# Patient Record
Sex: Male | Born: 1935 | Race: White | Hispanic: No | Smoking: Current every day smoker
Health system: Southern US, Community
[De-identification: ages and names within clinical notes are randomized; demographics above are authoritative.]

## PROBLEM LIST (undated history)

## (undated) DIAGNOSIS — H409 Unspecified glaucoma: Secondary | ICD-10-CM

## (undated) DIAGNOSIS — D649 Anemia, unspecified: Secondary | ICD-10-CM

## (undated) DIAGNOSIS — F419 Anxiety disorder, unspecified: Secondary | ICD-10-CM

## (undated) DIAGNOSIS — K589 Irritable bowel syndrome without diarrhea: Secondary | ICD-10-CM

## (undated) DIAGNOSIS — G2581 Restless legs syndrome: Secondary | ICD-10-CM

## (undated) DIAGNOSIS — I10 Essential (primary) hypertension: Secondary | ICD-10-CM

## (undated) DIAGNOSIS — Z8619 Personal history of other infectious and parasitic diseases: Secondary | ICD-10-CM

## (undated) HISTORY — DX: Irritable bowel syndrome, unspecified: K58.9

## (undated) HISTORY — DX: Essential (primary) hypertension: I10

## (undated) HISTORY — DX: Unspecified glaucoma: H40.9

## (undated) HISTORY — PX: BASAL CELL CARCINOMA EXCISION: SHX1214

## (undated) HISTORY — PX: TONSILLECTOMY AND ADENOIDECTOMY: SHX28

## (undated) HISTORY — DX: Restless legs syndrome: G25.81

## (undated) HISTORY — DX: Anemia, unspecified: D64.9

## (undated) HISTORY — DX: Anxiety disorder, unspecified: F41.9

## (undated) HISTORY — DX: Personal history of other infectious and parasitic diseases: Z86.19

---

## 1958-02-04 HISTORY — PX: THYROIDECTOMY: SHX17

## 1998-02-04 DIAGNOSIS — F419 Anxiety disorder, unspecified: Secondary | ICD-10-CM | POA: Insufficient documentation

## 2004-02-05 DIAGNOSIS — M810 Age-related osteoporosis without current pathological fracture: Secondary | ICD-10-CM | POA: Insufficient documentation

## 2004-02-05 DIAGNOSIS — R972 Elevated prostate specific antigen [PSA]: Secondary | ICD-10-CM | POA: Insufficient documentation

## 2005-04-22 ENCOUNTER — Inpatient Hospital Stay: Payer: Self-pay | Admitting: General Surgery

## 2005-04-22 ENCOUNTER — Ambulatory Visit: Payer: Self-pay | Admitting: Family Medicine

## 2005-04-24 ENCOUNTER — Other Ambulatory Visit: Payer: Self-pay

## 2006-02-04 HISTORY — PX: APPENDECTOMY: SHX54

## 2008-02-05 HISTORY — PX: HIP FRACTURE SURGERY: SHX118

## 2008-05-16 ENCOUNTER — Ambulatory Visit: Payer: Self-pay | Admitting: Family Medicine

## 2008-05-24 ENCOUNTER — Emergency Department: Payer: Self-pay | Admitting: Emergency Medicine

## 2008-05-27 ENCOUNTER — Ambulatory Visit: Payer: Self-pay | Admitting: Family Medicine

## 2008-07-07 ENCOUNTER — Ambulatory Visit: Payer: Self-pay | Admitting: Family Medicine

## 2008-08-01 ENCOUNTER — Ambulatory Visit: Payer: Self-pay | Admitting: Family Medicine

## 2008-11-09 DIAGNOSIS — G4700 Insomnia, unspecified: Secondary | ICD-10-CM | POA: Insufficient documentation

## 2009-01-07 ENCOUNTER — Inpatient Hospital Stay: Payer: Self-pay | Admitting: Specialist

## 2009-01-15 ENCOUNTER — Other Ambulatory Visit: Payer: Self-pay | Admitting: Family Medicine

## 2009-03-06 ENCOUNTER — Ambulatory Visit: Payer: Self-pay | Admitting: Family Medicine

## 2009-03-23 ENCOUNTER — Ambulatory Visit: Payer: Self-pay | Admitting: Family Medicine

## 2009-05-20 DIAGNOSIS — I279 Pulmonary heart disease, unspecified: Secondary | ICD-10-CM | POA: Insufficient documentation

## 2010-11-07 IMAGING — CR DG FEMUR 2V*R*
1 series · 4 of 4 positions shown · non-contrast
Comparison: none

REASON FOR EXAM: post op
COMMENTS:   Bedside (portable):Y

[Series 1: view not recorded · 0.17mm/px · 4 of 4 slices shown]
[im 1/4]
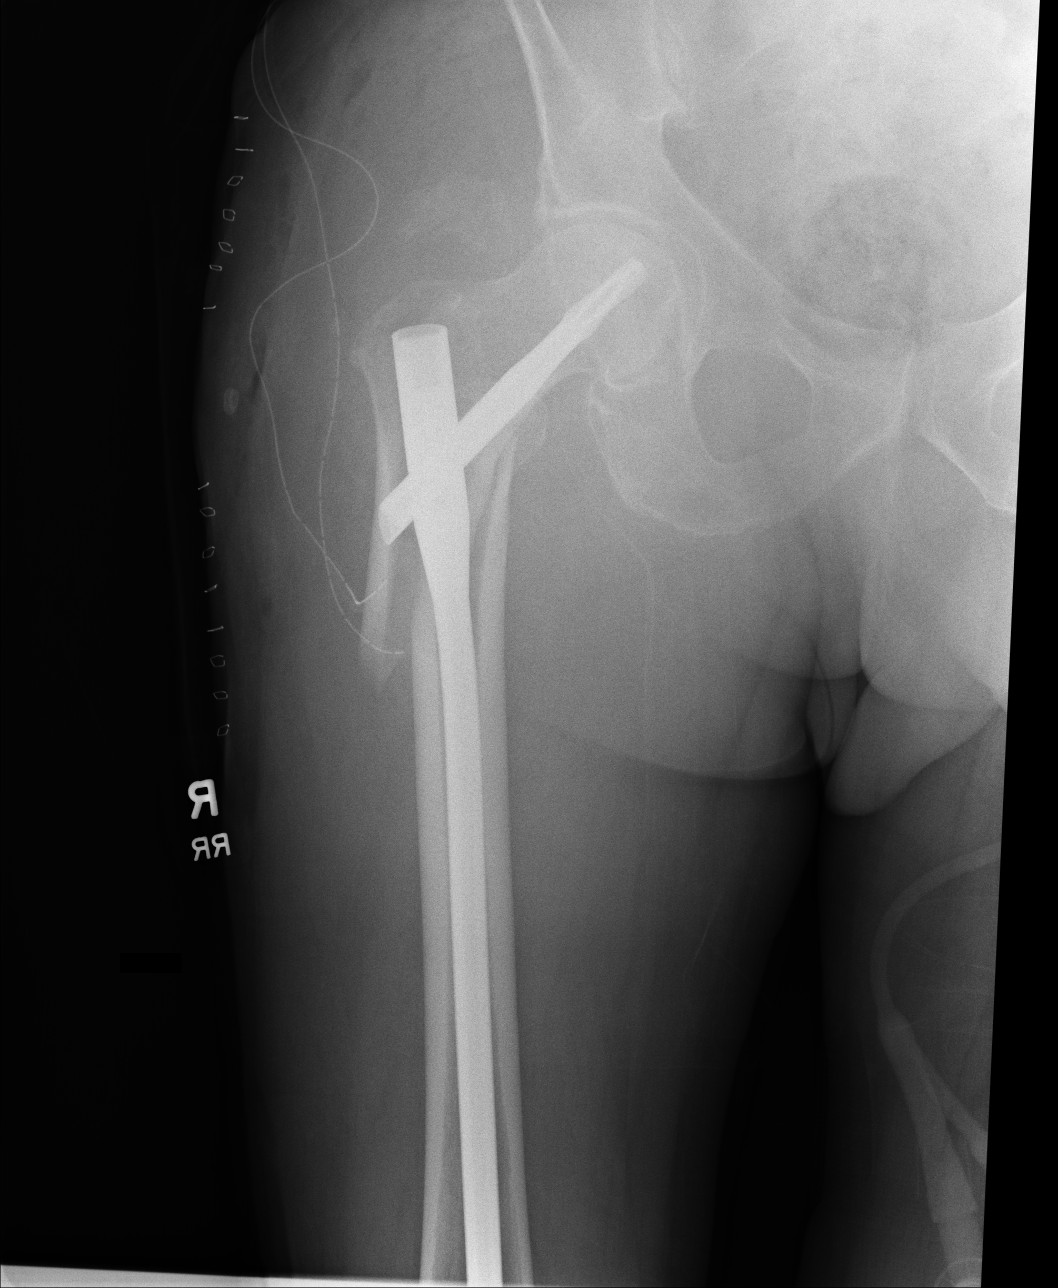
[im 2/4]
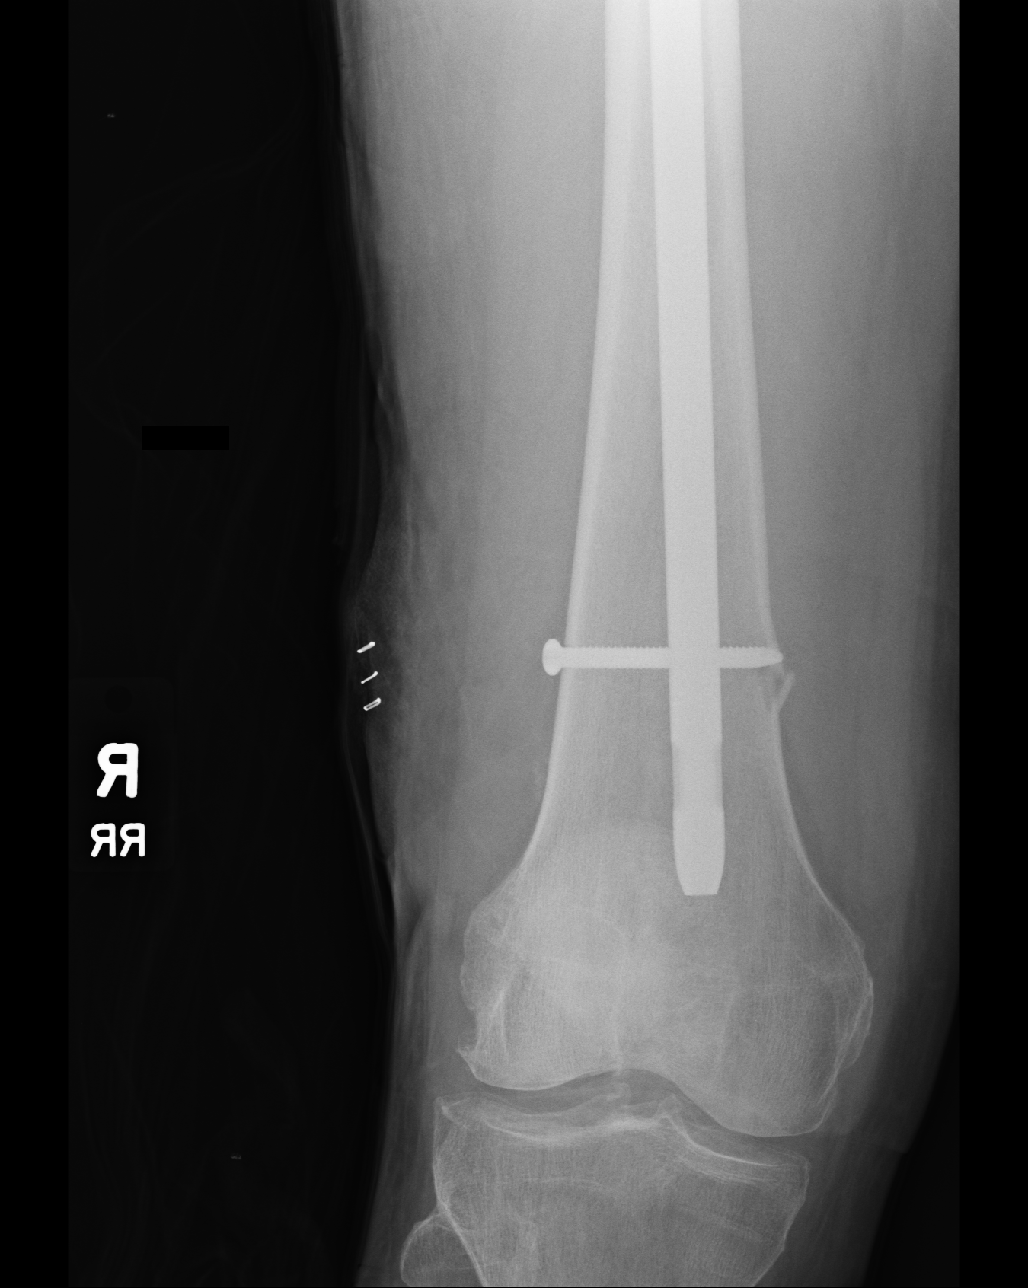
[im 3/4]
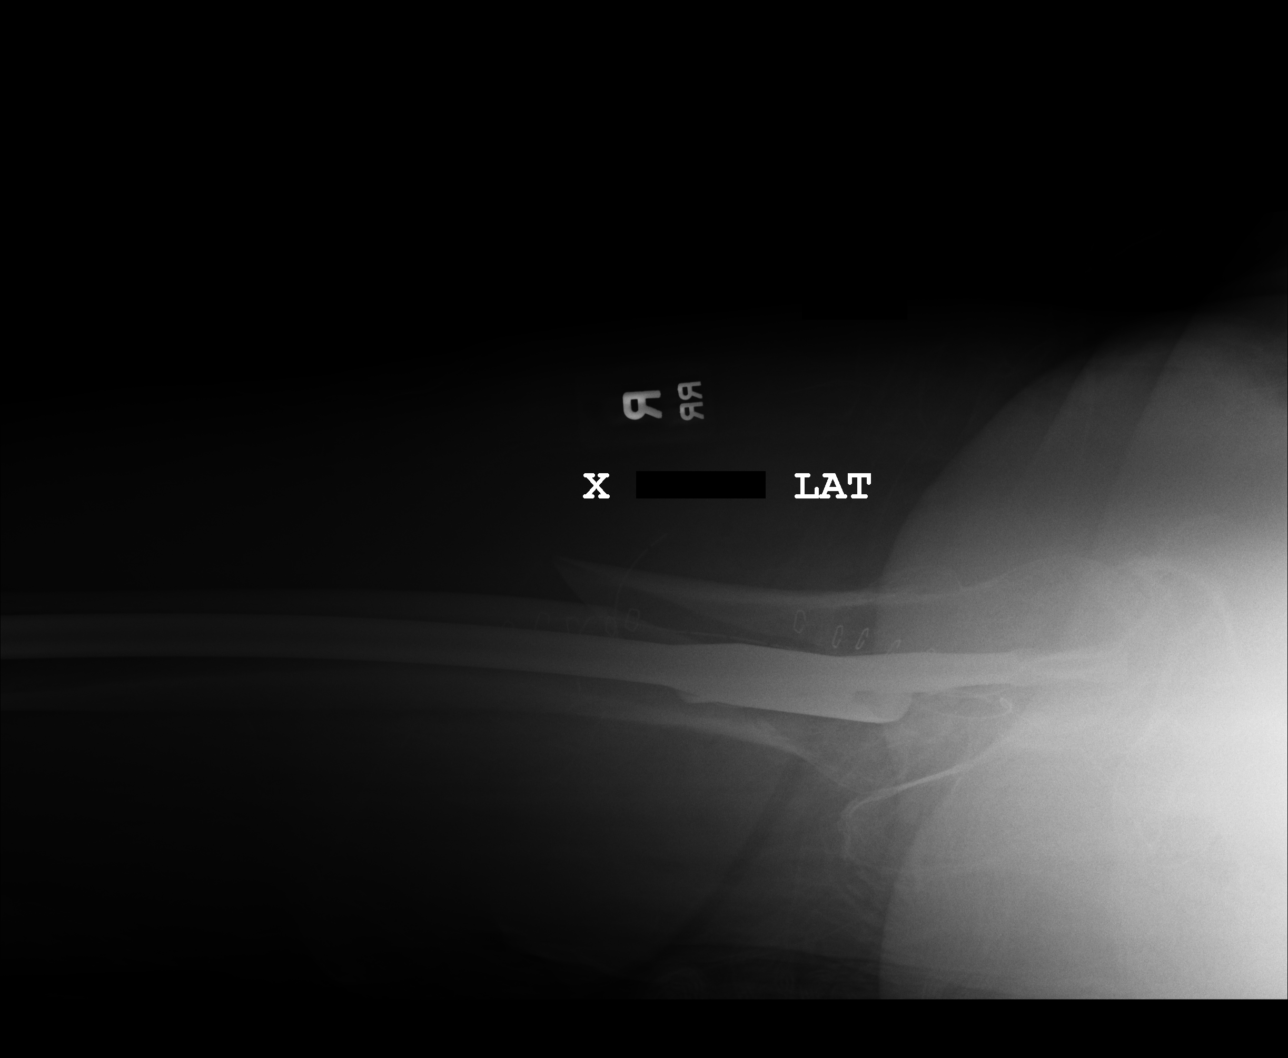
[im 4/4]
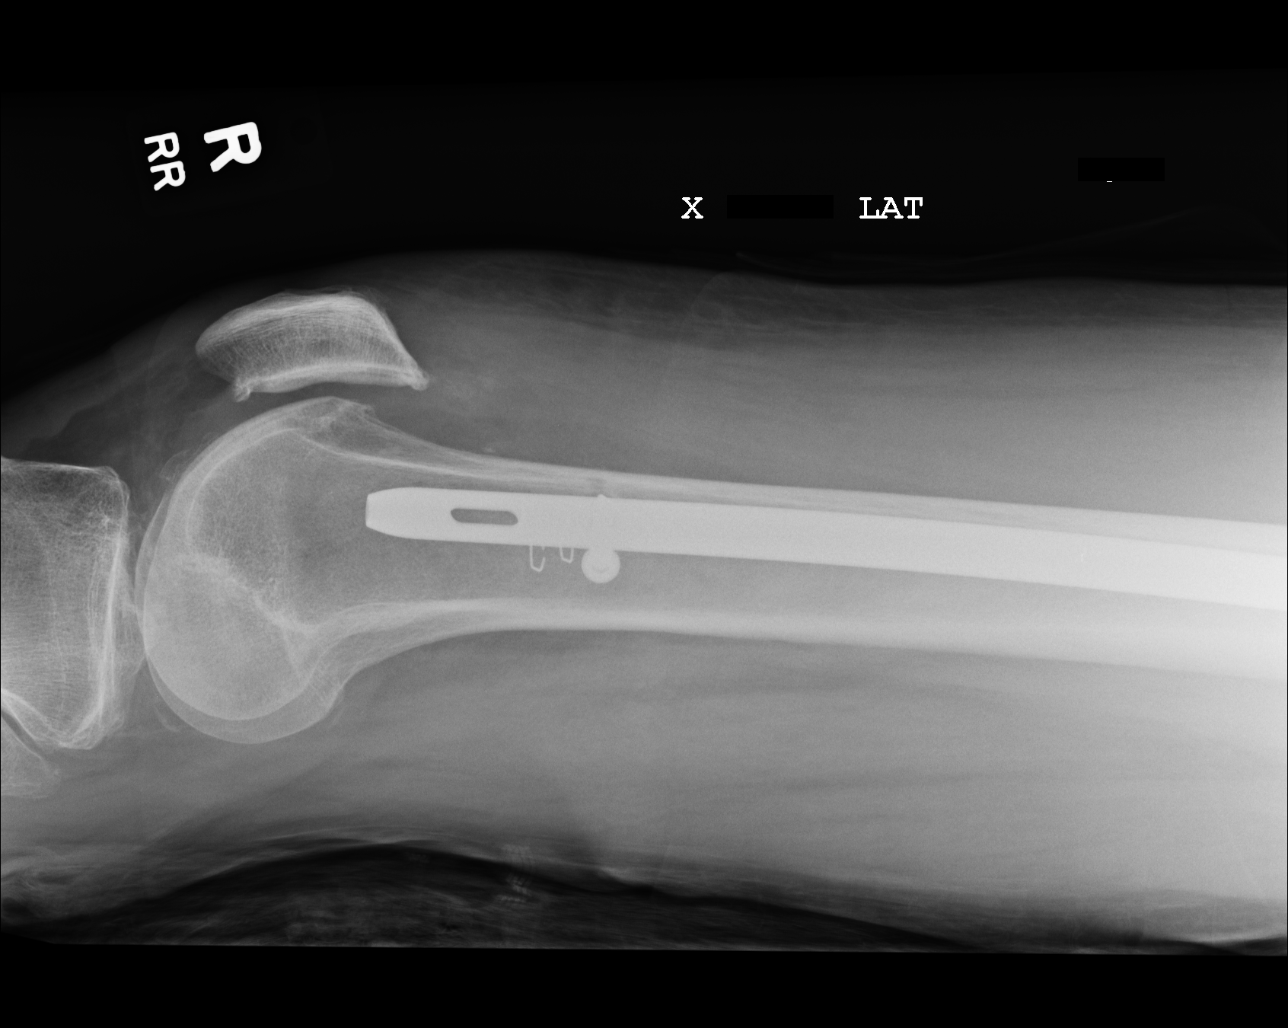

[4 of 4 positions shown; findings below may reference images not displayed]

PROCEDURE:     DXR - DXR FEMUR RIGHT  - January 08, 2009  [DATE]

RESULT:     The patient has undergone ORIF for an intertrochanteric/
subtrochanteric fracture. The orthopedic telescoping screw and
intramedullary rod appear to be in good radiographic position. There is
remains angulation at the fracture site. There is disruption of the medial
cortex at the tip of the cortical screw in the distal femur.
IMPRESSION: The patient has undergone ORIF for a
intertrochanteric-subtrochanteric fracture of the right hip. Further
interpretation is deferred to Dr. Klippel Metzker.

## 2010-11-08 IMAGING — CR DG FEMUR 2V*R*
1 series · 3 of 3 positions shown · non-contrast
Comparison: none

REASON FOR EXAM: PT heard pop while performing exercises
COMMENTS:   Bedside (portable):Y

[Series 1: view not recorded · 0.17mm/px · 3 of 3 slices shown]
[im 1/3]
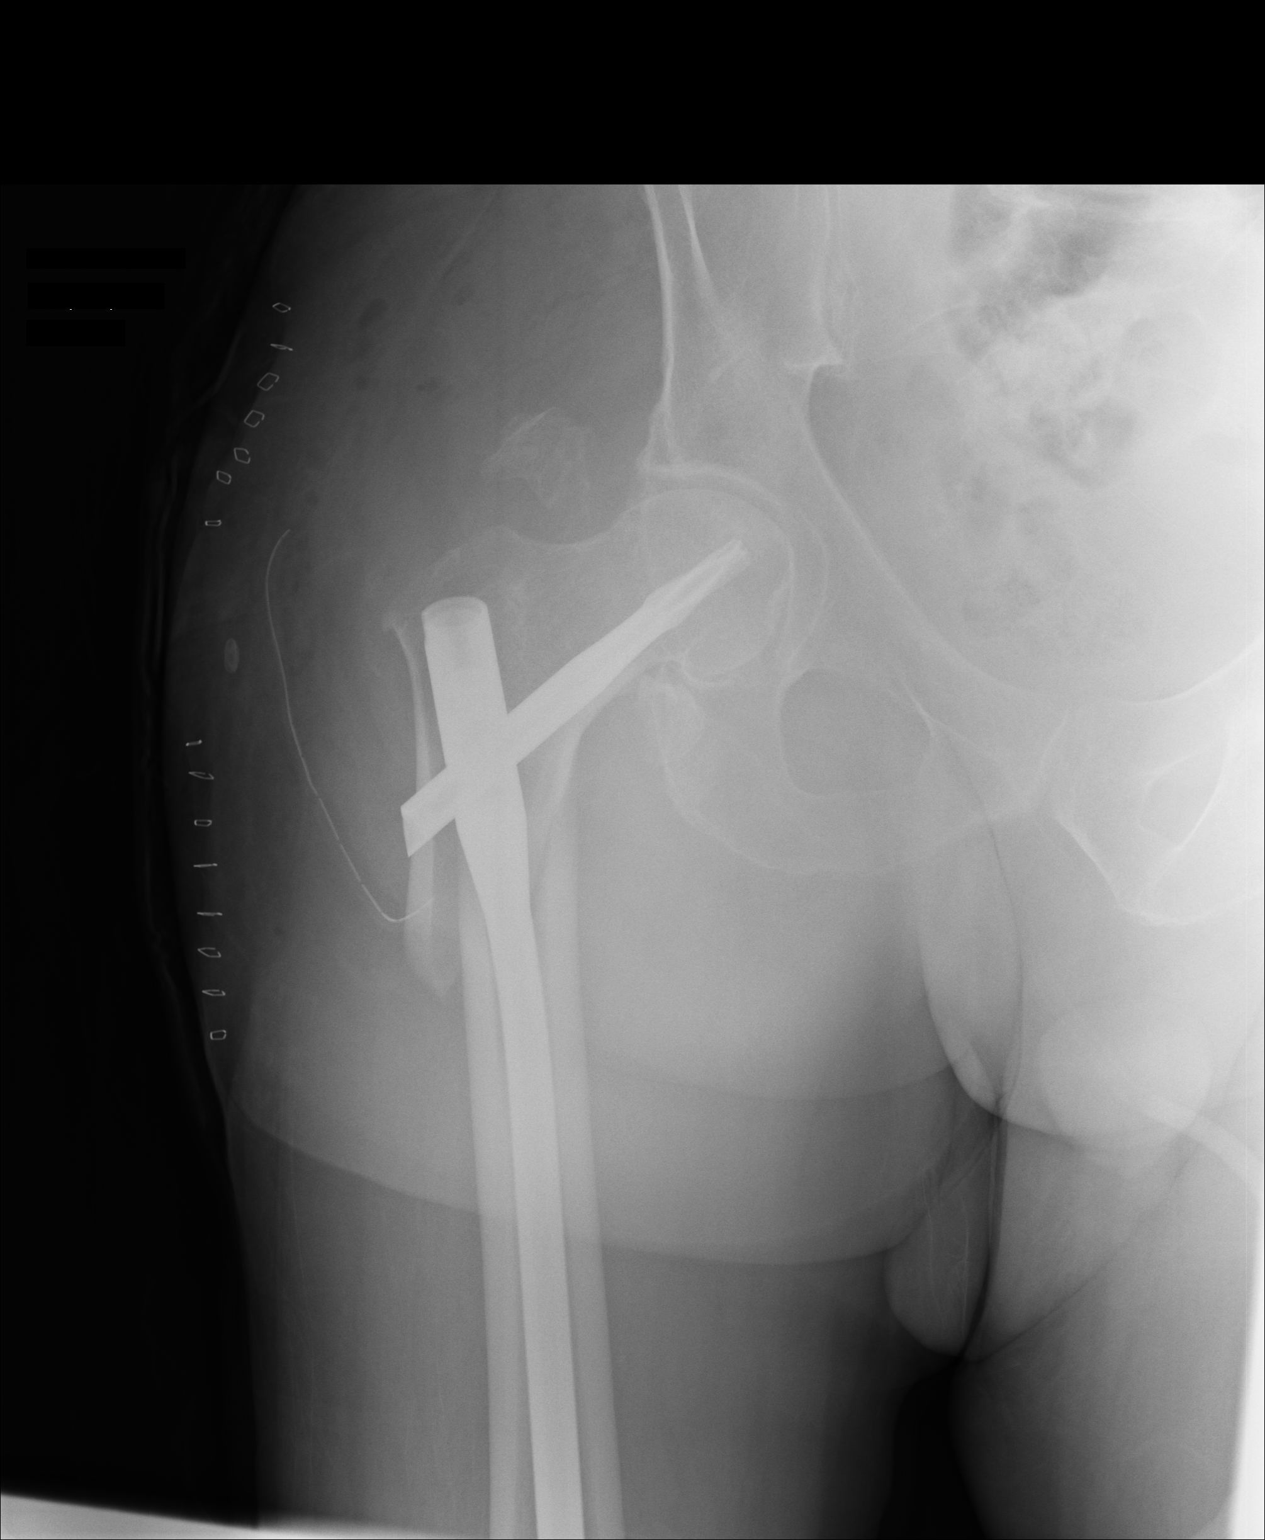
[im 2/3]
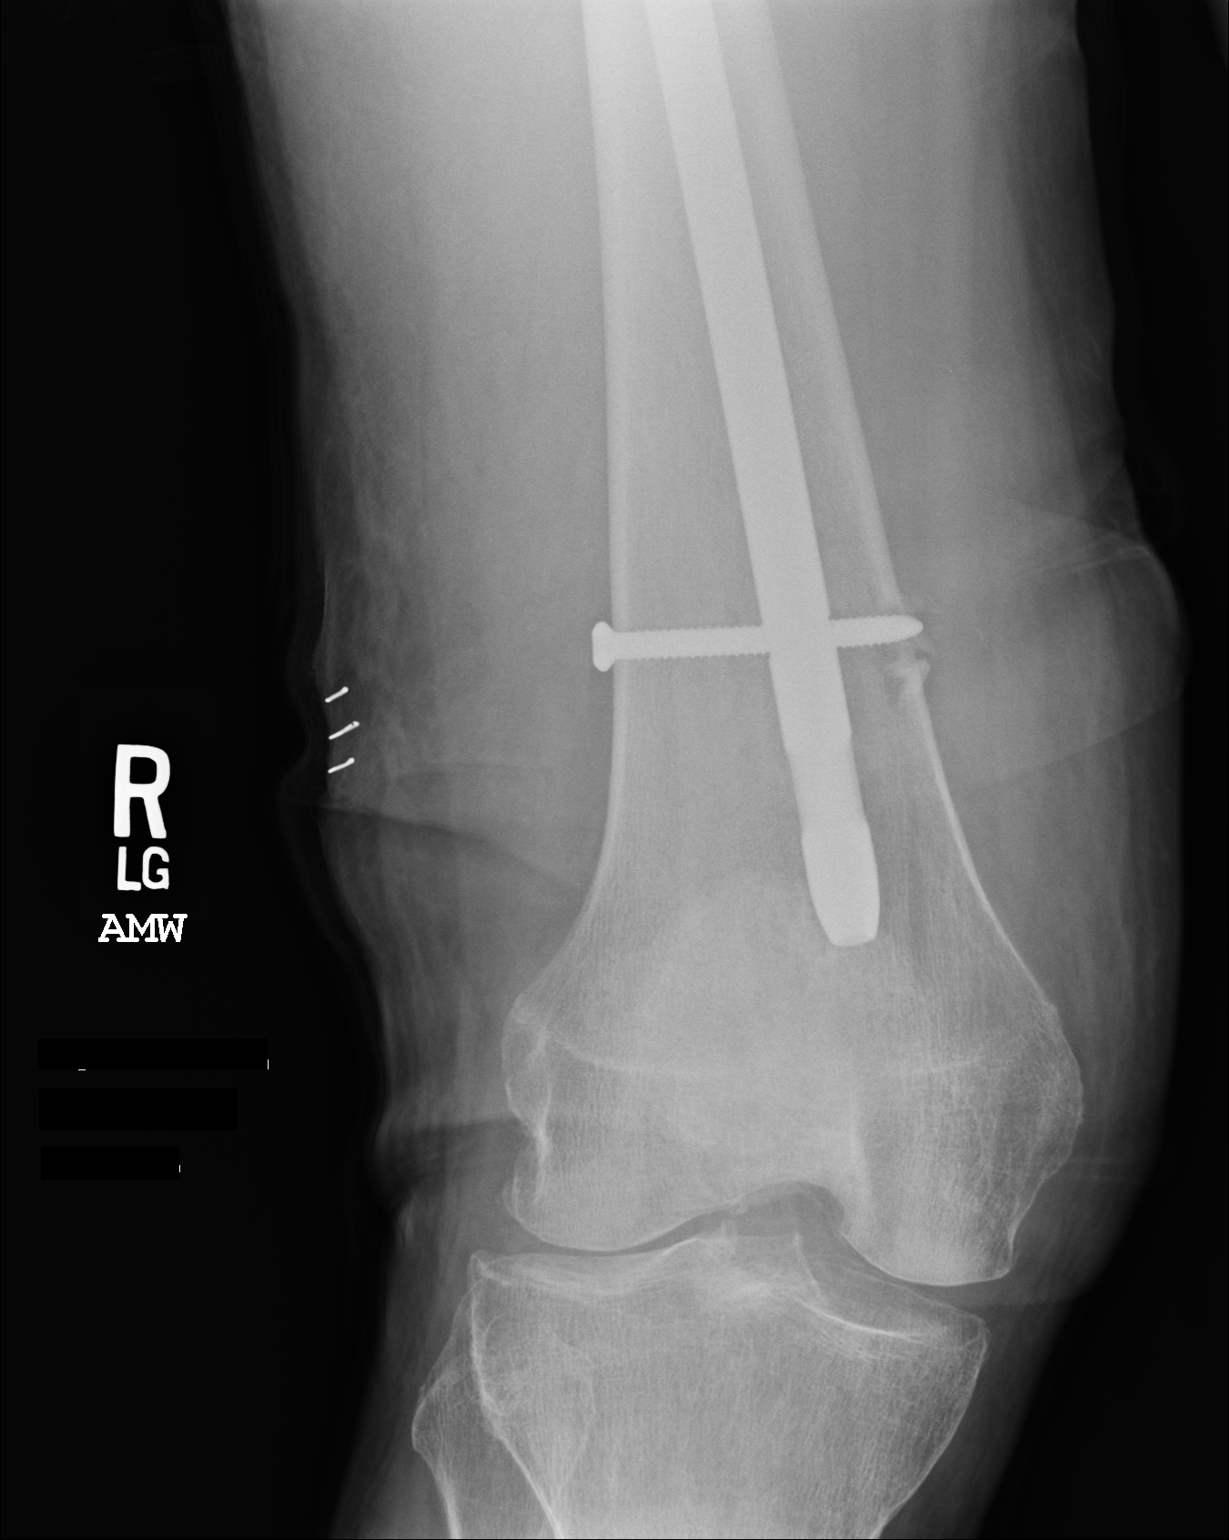
[im 3/3]
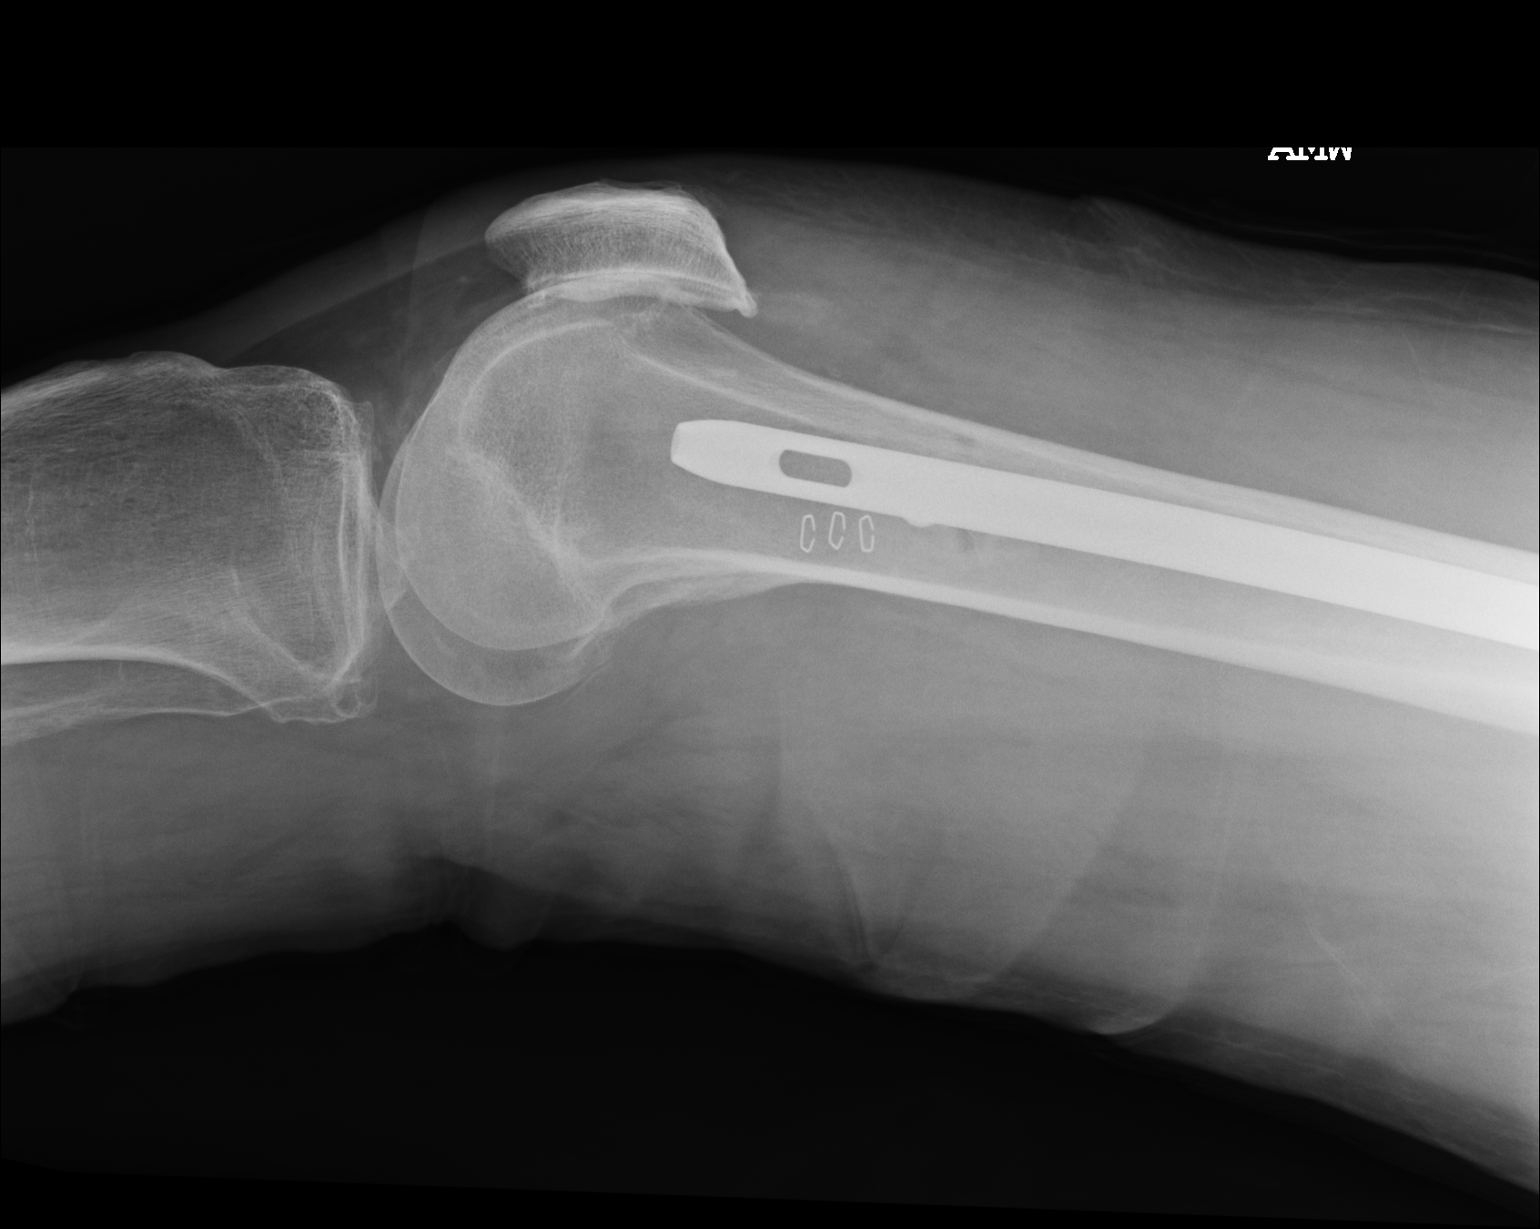

[3 of 3 positions shown; findings below may reference images not displayed]

PROCEDURE:     DXR - DXR FEMUR RIGHT  - January 09, 2009  [DATE]

RESULT:     Comparison is made to a prior study dated 01/08/2009.

The patient is status post intramedullary rod and sliding lag screw fixation
of the proximal hip fracture. Lateral angulation is appreciated at the
fracture site. The hardware appears intact without loosening or failure.
Cannulated screw fixation is appreciated within the distal femoral rod. The
medial portion of the screw projects in a region of cortical disruption of
the distal medial femur. When compared to the previous study there has been
no significant change. Skin staples and surgical drains are appreciated
along the hip region.
IMPRESSION: No significant change in the open reduction internal
fixation of the right femur.

## 2011-04-04 ENCOUNTER — Ambulatory Visit: Payer: Self-pay | Admitting: Family Medicine

## 2011-04-05 ENCOUNTER — Ambulatory Visit: Payer: Self-pay | Admitting: Internal Medicine

## 2011-04-06 ENCOUNTER — Inpatient Hospital Stay: Payer: Self-pay | Admitting: Internal Medicine

## 2011-04-06 LAB — URINALYSIS, COMPLETE
Bacteria: NONE SEEN
Bilirubin,UR: NEGATIVE
Glucose,UR: NEGATIVE mg/dL (ref 0–75)
Hyaline Cast: 9
Ph: 7 (ref 4.5–8.0)
Protein: NEGATIVE
Specific Gravity: 1.025 (ref 1.003–1.030)
Squamous Epithelial: NONE SEEN
WBC UR: <1 /HPF (ref 0–5)

## 2011-04-06 LAB — CBC
HCT: 22.7 % — ABNORMAL LOW (ref 40.0–52.0)
HGB: 7.7 g/dL — ABNORMAL LOW (ref 13.0–18.0)
MCHC: 33.8 g/dL (ref 32.0–36.0)
MCV: 105 fL — ABNORMAL HIGH (ref 80–100)
Platelet: 291 10*3/uL (ref 150–440)
RBC: 2.17 10*6/uL — ABNORMAL LOW (ref 4.40–5.90)
RDW: 13.5 % (ref 11.5–14.5)

## 2011-04-06 LAB — FERRITIN: Ferritin (ARMC): 74 ng/mL (ref 8–388)

## 2011-04-06 LAB — IRON AND TIBC
Iron Saturation: 22 %
Iron: 36 ug/dL — ABNORMAL LOW (ref 65–175)
Unbound Iron-Bind.Cap.: 128 ug/dL

## 2011-04-06 LAB — COMPREHENSIVE METABOLIC PANEL
Alkaline Phosphatase: 82 U/L (ref 50–136)
Anion Gap: 10 (ref 7–16)
BUN: 58 mg/dL — ABNORMAL HIGH (ref 7–18)
Calcium, Total: 8.2 mg/dL — ABNORMAL LOW (ref 8.5–10.1)
Co2: 33 mmol/L — ABNORMAL HIGH (ref 21–32)
EGFR (Non-African Amer.): 45 — ABNORMAL LOW
Osmolality: 281 (ref 275–301)
SGOT(AST): 19 U/L (ref 15–37)
Sodium: 132 mmol/L — ABNORMAL LOW (ref 136–145)

## 2011-04-06 LAB — FOLATE: Folic Acid: 4 ng/mL (ref 3.1–100.0)

## 2011-04-06 LAB — LIPASE, BLOOD: Lipase: 60 U/L — ABNORMAL LOW (ref 73–393)

## 2011-04-06 LAB — TROPONIN I: Troponin-I: <0.02 ng/mL

## 2011-04-07 HISTORY — PX: UPPER GI ENDOSCOPY: SHX6162

## 2011-04-07 LAB — CBC WITH DIFFERENTIAL/PLATELET
Basophil #: 0 10*3/uL (ref 0.0–0.1)
Basophil %: 0 %
Eosinophil %: 0 %
HCT: 26.9 % — ABNORMAL LOW (ref 40.0–52.0)
HGB: 9.1 g/dL — ABNORMAL LOW (ref 13.0–18.0)
Lymphocyte #: 0.4 10*3/uL — ABNORMAL LOW (ref 1.0–3.6)
MCH: 32.6 pg (ref 26.0–34.0)
MCV: 96 fL (ref 80–100)
Monocyte #: 0.5 10*3/uL (ref 0.0–0.7)
Monocyte %: 2.5 %
Neutrophil #: 17.6 10*3/uL — ABNORMAL HIGH (ref 1.4–6.5)
Platelet: 237 10*3/uL (ref 150–440)
RBC: 2.79 10*6/uL — ABNORMAL LOW (ref 4.40–5.90)
WBC: 18.4 10*3/uL — ABNORMAL HIGH (ref 3.8–10.6)

## 2011-04-07 LAB — BASIC METABOLIC PANEL
Anion Gap: 16 (ref 7–16)
BUN: 53 mg/dL — ABNORMAL HIGH (ref 7–18)
Chloride: 92 mmol/L — ABNORMAL LOW (ref 98–107)
Co2: 30 mmol/L (ref 21–32)
Creatinine: 1.11 mg/dL (ref 0.60–1.30)
EGFR (African American): >60
EGFR (Non-African Amer.): >60
Glucose: 133 mg/dL — ABNORMAL HIGH (ref 65–99)
Osmolality: 292 (ref 275–301)
Potassium: 3.2 mmol/L — ABNORMAL LOW (ref 3.5–5.1)
Sodium: 138 mmol/L (ref 136–145)

## 2011-04-07 LAB — HEMOGLOBIN
HGB: 8.4 g/dL — ABNORMAL LOW (ref 13.0–18.0)
HGB: 7.3 g/dL — ABNORMAL LOW (ref 13.0–18.0)

## 2011-04-08 LAB — CBC WITH DIFFERENTIAL/PLATELET
Basophil #: 0 10*3/uL (ref 0.0–0.1)
Eosinophil #: 0 10*3/uL (ref 0.0–0.7)
Lymphocyte %: 1.9 %
MCH: 31.1 pg (ref 26.0–34.0)
MCHC: 32.7 g/dL (ref 32.0–36.0)
Monocyte #: 0.7 10*3/uL (ref 0.0–0.7)
Monocyte %: 5.3 %
Neutrophil #: 12.7 10*3/uL — ABNORMAL HIGH (ref 1.4–6.5)
Neutrophil %: 92.8 %
Platelet: 167 10*3/uL (ref 150–440)
RDW: 21.2 % — ABNORMAL HIGH (ref 11.5–14.5)
WBC: 13.7 10*3/uL — ABNORMAL HIGH (ref 3.8–10.6)

## 2011-04-08 LAB — BASIC METABOLIC PANEL
BUN: 39 mg/dL — ABNORMAL HIGH (ref 7–18)
Calcium, Total: 7.4 mg/dL — ABNORMAL LOW (ref 8.5–10.1)
Chloride: 100 mmol/L (ref 98–107)
EGFR (African American): >60
EGFR (Non-African Amer.): >60
Glucose: 177 mg/dL — ABNORMAL HIGH (ref 65–99)
Osmolality: 295 (ref 275–301)
Potassium: 3.3 mmol/L — ABNORMAL LOW (ref 3.5–5.1)
Sodium: 141 mmol/L (ref 136–145)

## 2011-04-08 LAB — MAGNESIUM: Magnesium: 2.3 mg/dL

## 2011-04-08 LAB — URINE CULTURE

## 2011-04-08 LAB — HEMOGLOBIN A1C: Hemoglobin A1C: 5.7 % (ref 4.2–6.3)

## 2011-04-10 LAB — CBC WITH DIFFERENTIAL/PLATELET
Basophil #: 0 10*3/uL (ref 0.0–0.1)
HGB: 9.2 g/dL — ABNORMAL LOW (ref 13.0–18.0)
Lymphocyte #: 0.2 10*3/uL — ABNORMAL LOW (ref 1.0–3.6)
Lymphocyte %: 1.6 %
Monocyte #: 1 10*3/uL — ABNORMAL HIGH (ref 0.0–0.7)
Monocyte %: 6.7 %
Neutrophil %: 91.7 %
Platelet: 184 10*3/uL (ref 150–440)
RBC: 2.87 10*6/uL — ABNORMAL LOW (ref 4.40–5.90)
RDW: 19.5 % — ABNORMAL HIGH (ref 11.5–14.5)

## 2011-04-11 LAB — COMPREHENSIVE METABOLIC PANEL
Albumin: 2.5 g/dL — ABNORMAL LOW (ref 3.4–5.0)
BUN: 43 mg/dL — ABNORMAL HIGH (ref 7–18)
Calcium, Total: 7.6 mg/dL — ABNORMAL LOW (ref 8.5–10.1)
Chloride: 99 mmol/L (ref 98–107)
Co2: 26 mmol/L (ref 21–32)
EGFR (African American): >60
Osmolality: 287 (ref 275–301)
SGOT(AST): 90 U/L — ABNORMAL HIGH (ref 15–37)
SGPT (ALT): 77 U/L
Sodium: 138 mmol/L (ref 136–145)
Total Protein: 5 g/dL — ABNORMAL LOW (ref 6.4–8.2)

## 2011-04-11 LAB — HEMOGLOBIN: HGB: 8.2 g/dL — ABNORMAL LOW (ref 13.0–18.0)

## 2011-04-11 LAB — CBC WITH DIFFERENTIAL/PLATELET
Comment - H1-Com2: NORMAL
HCT: 22.4 % — ABNORMAL LOW (ref 40.0–52.0)
HGB: 7.4 g/dL — ABNORMAL LOW (ref 13.0–18.0)
MCH: 31.7 pg (ref 26.0–34.0)
MCHC: 33 g/dL (ref 32.0–36.0)
MCV: 96 fL (ref 80–100)
RDW: 20.1 % — ABNORMAL HIGH (ref 11.5–14.5)
Segmented Neutrophils: 76 %

## 2011-04-12 LAB — HEMOGLOBIN: HGB: 8.3 g/dL — ABNORMAL LOW (ref 13.0–18.0)

## 2011-04-12 LAB — URINE CULTURE

## 2011-04-12 LAB — HEMATOCRIT: HCT: 25 % — ABNORMAL LOW (ref 40.0–52.0)

## 2011-04-12 LAB — WBC: WBC: 21.2 10*3/uL — ABNORMAL HIGH (ref 3.8–10.6)

## 2011-04-13 LAB — CBC WITH DIFFERENTIAL/PLATELET
Basophil #: 0 10*3/uL (ref 0.0–0.1)
Basophil %: 0 %
Eosinophil %: 0.9 %
HCT: 23.7 % — ABNORMAL LOW (ref 40.0–52.0)
Lymphocyte #: 1.2 10*3/uL (ref 1.0–3.6)
Lymphocyte %: 7.4 %
MCHC: 33.1 g/dL (ref 32.0–36.0)
Monocyte %: 8.6 %
Neutrophil %: 83.1 %
Platelet: 197 10*3/uL (ref 150–440)
RBC: 2.52 10*6/uL — ABNORMAL LOW (ref 4.40–5.90)
RDW: 20.6 % — ABNORMAL HIGH (ref 11.5–14.5)
WBC: 16.1 10*3/uL — ABNORMAL HIGH (ref 3.8–10.6)

## 2011-04-13 LAB — BASIC METABOLIC PANEL
BUN: 17 mg/dL (ref 7–18)
Calcium, Total: 7.2 mg/dL — ABNORMAL LOW (ref 8.5–10.1)
Chloride: 97 mmol/L — ABNORMAL LOW (ref 98–107)
Co2: 27 mmol/L (ref 21–32)
Creatinine: 0.73 mg/dL (ref 0.60–1.30)
EGFR (Non-African Amer.): >60
Glucose: 136 mg/dL — ABNORMAL HIGH (ref 65–99)
Potassium: 3.4 mmol/L — ABNORMAL LOW (ref 3.5–5.1)
Sodium: 136 mmol/L (ref 136–145)

## 2011-04-15 LAB — CBC WITH DIFFERENTIAL/PLATELET
Basophil #: 0 10*3/uL (ref 0.0–0.1)
Basophil %: 0.1 %
Eosinophil %: 0.9 %
HGB: 9.9 g/dL — ABNORMAL LOW (ref 13.0–18.0)
Lymphocyte #: 1 10*3/uL (ref 1.0–3.6)
Lymphocyte %: 6.6 %
MCH: 30.5 pg (ref 26.0–34.0)
MCV: 93 fL (ref 80–100)
Monocyte %: 11.2 %
Platelet: 263 10*3/uL (ref 150–440)
RBC: 3.23 10*6/uL — ABNORMAL LOW (ref 4.40–5.90)

## 2011-05-06 ENCOUNTER — Ambulatory Visit: Payer: Self-pay | Admitting: Internal Medicine

## 2013-01-31 IMAGING — CR DG ABDOMEN 1V
1 series · 2 of 2 positions shown · non-contrast
Comparison: none

REASON FOR EXAM: constipation
COMMENTS:

[Series 1: supine kub · 0.17mm/px · 2 of 2 slices shown]
[im 1/2]
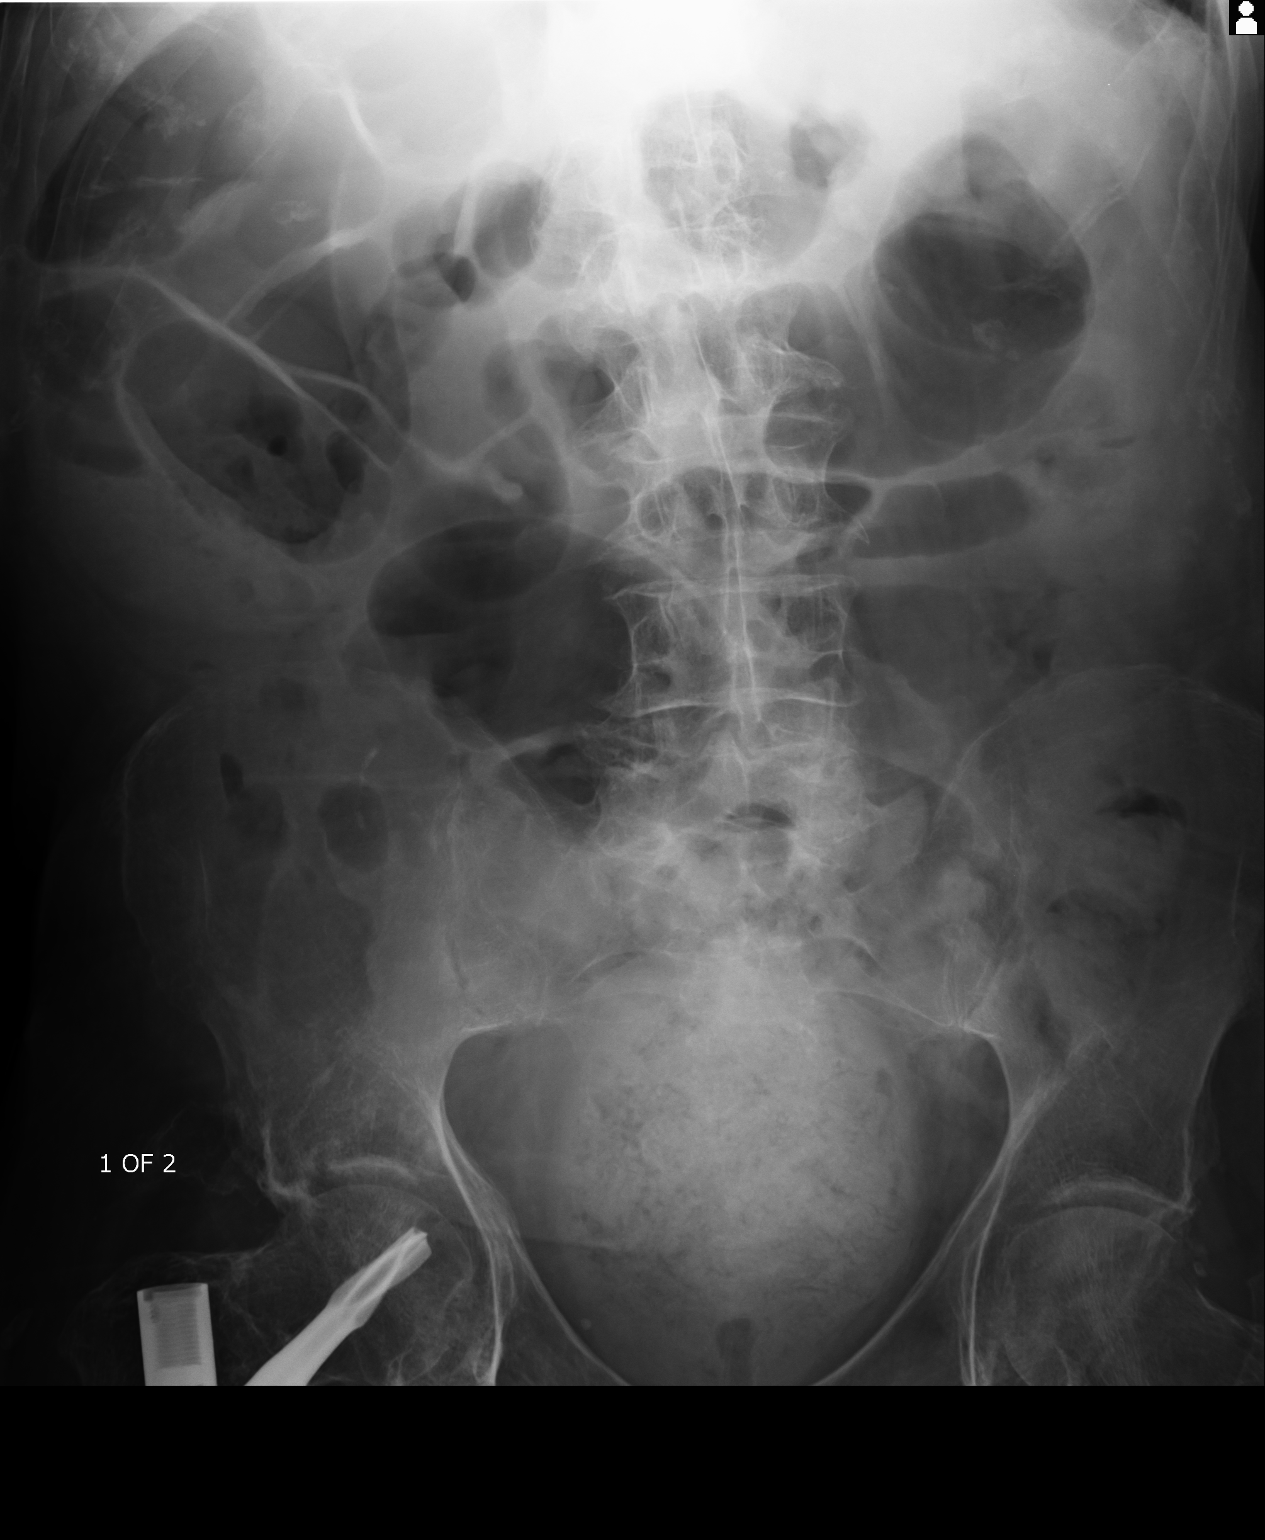
[im 2/2]
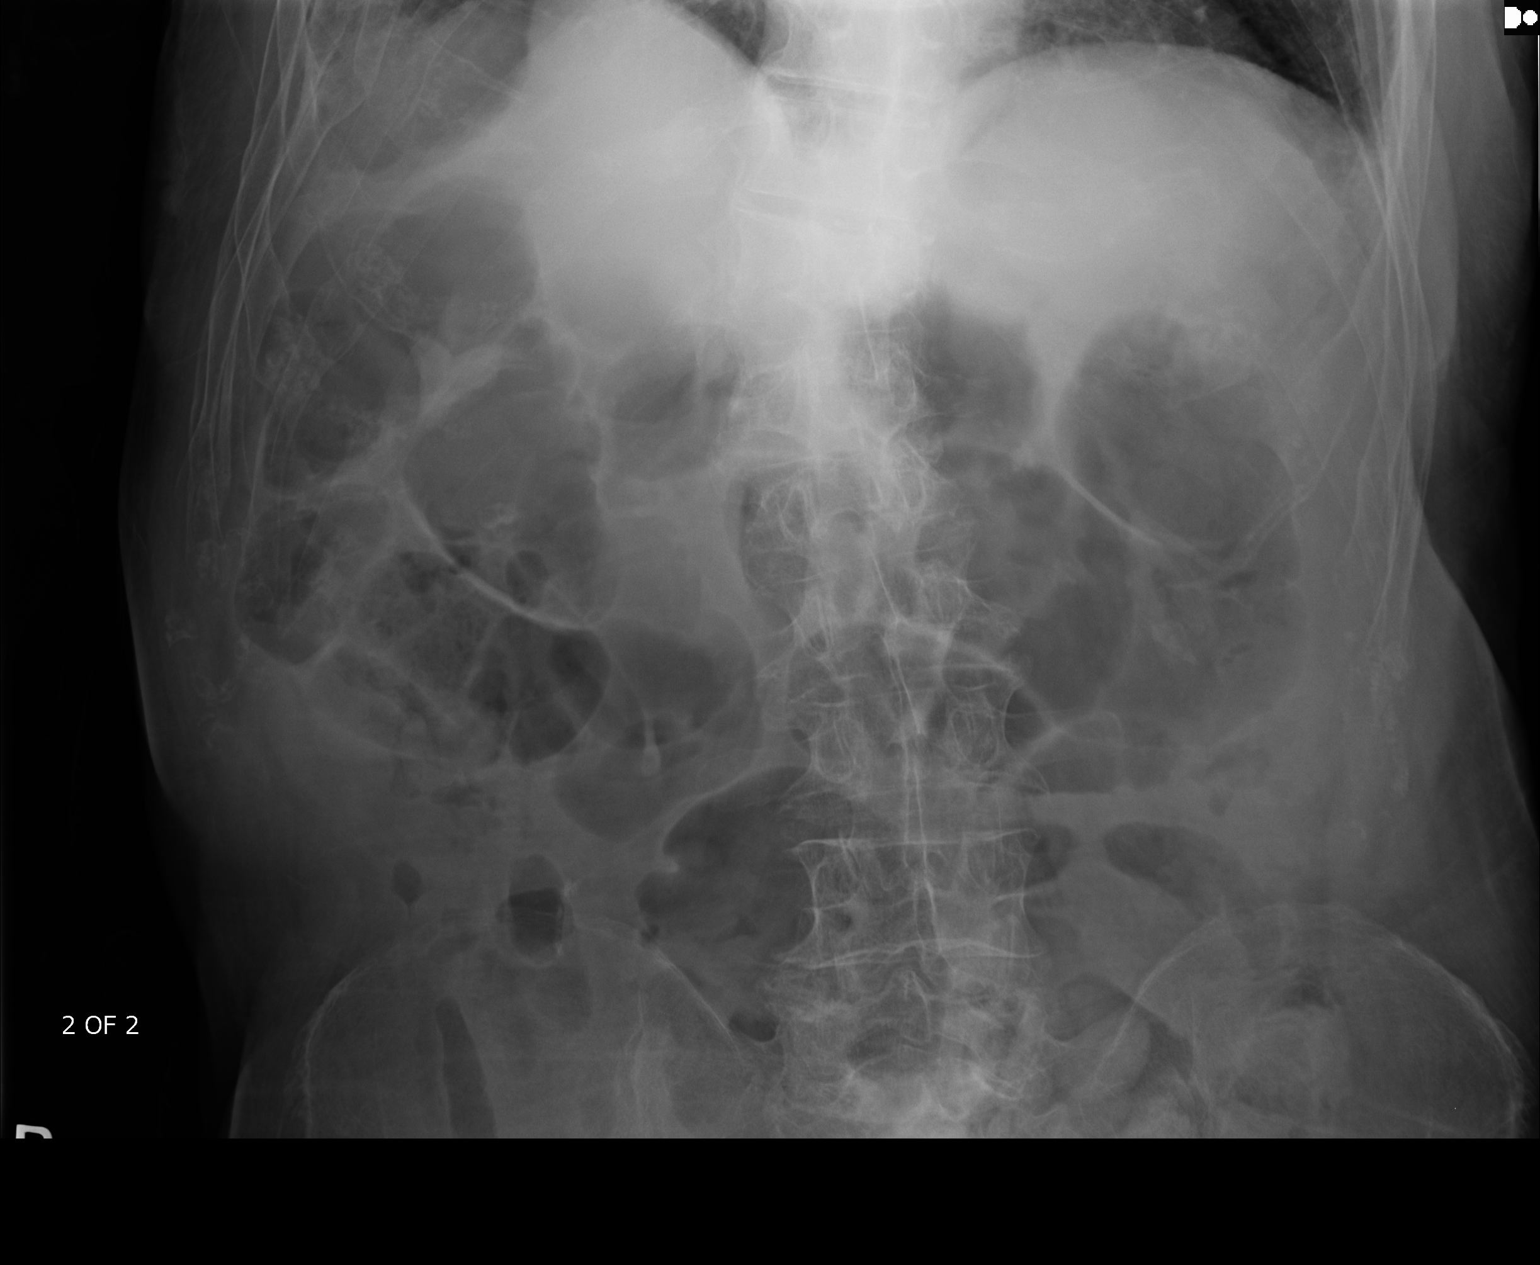

[2 of 2 positions shown; findings below may reference images not displayed]

PROCEDURE:     KDR - KDXR KIDNEY URETER BLADDER  - April 04, 2011  [DATE]

RESULT:     View of the abdomen shows a large amount of fecal material in
the area of the rectum and rectosigmoid region extending toward the region
of the anus consistent with a large fecal impaction. There is some air
within loops of slightly distended loops of small bowel in the upper
abdominal region. The visualized lung bases are clear. No pneumatosis or
perforation is evident. Previous ORIF of a right hip fracture is noted.
Degenerative changes are seen in the spine and left hip.
IMPRESSION: 1. Fecal impaction. Followup following disimpaction may be beneficial to
evaluate for bowel obstruction if the patient remains symptomatic.

## 2013-11-09 LAB — BASIC METABOLIC PANEL
BUN: 18 mg/dL (ref 4–21)
Creatinine: 0.9 mg/dL (ref 0.6–1.3)
Glucose: 88 mg/dL
Sodium: 138 mmol/L (ref 137–147)

## 2013-11-09 LAB — TSH: TSH: 3.33 u[IU]/mL (ref 0.41–5.90)

## 2014-05-29 NOTE — Consult Note (Signed)
Chief Complaint:   Subjective/Chief Complaint No bowel movements today. Repeat hemoglobin stable at 8.3 (was 8.2 last night) There are no signs of active bleeding and the drop in his H and H may have been due to some bleeding from the site of cautrization due to clot dislodgement. Will continue Octreotide and Protonix drip. Cancel EGD. Start clear liquids. CBC in am. Dr. Donnella Sham will follow him over the weekend.   Electronic Signatures: Jill Side (MD)  (Signed 08-Mar-13 11:47)  Authored: Chief Complaint   Last Updated: 08-Mar-13 11:47 by Jill Side (MD)

## 2014-05-29 NOTE — Consult Note (Signed)
Chief Complaint:   Subjective/Chief Complaint doing well denies n or vomiting, no bm since yesterday am.   VITAL SIGNS/ANCILLARY NOTES: **Vital Signs.:   09-Mar-13 15:15   Vital Signs Type 1 hr Post Blood   Temperature Temperature (F) 98.1   Celsius 36.7   Temperature Source oral   Pulse Pulse 90   Pulse source per Dinamap   Respirations Respirations 18   Systolic BP Systolic BP 505   Diastolic BP (mmHg) Diastolic BP (mmHg) 82   Mean BP 97   BP Source Dinamap   Pulse Ox % Pulse Ox % 98   Oxygen Delivery 3L   Brief Assessment:   Cardiac Regular    Respiratory clear BS    Gastrointestinal details normal Soft  Nontender  Nondistended  No masses palpable  Bowel sounds normal   Routine Chem:  07-Mar-13 13:23    BUN 43  Routine Hem:  09-Mar-13 04:20    WBC (CBC) 16.1   RBC (CBC) 2.52   Hemoglobin (CBC) 7.9   Hematocrit (CBC) 23.7   Platelet Count (CBC) 197   MCV 94   MCH 31.2   MCHC 33.1   RDW 20.6   Neutrophil % 83.1   Lymphocyte % 7.4   Monocyte % 8.6   Eosinophil % 0.9   Basophil % 0.0   Neutrophil # 13.4   Lymphocyte # 1.2   Monocyte # 1.4   Eosinophil # 0.1   Basophil # 0.0  Routine Chem:  09-Mar-13 15:13    Glucose, Serum 136   BUN 17   Creatinine (comp) 0.73   Sodium, Serum 136   Potassium, Serum 3.4   Chloride, Serum 97   CO2, Serum 27   Calcium (Total), Serum 7.2   Osmolality (calc) 276   eGFR (African American) >60   eGFR (Non-African American) >60   Anion Gap 12   Assessment/Plan:  Assessment/Plan:   Assessment 1) upper gi bleed due to duodenal ulcer.  some decrease of hgb this am, likely not indicative of recurrent bleeding as the bun has improved and there has been no bm since yesterday am.   Ulcer likely due to use of nsaid (meloxicam)  but will order h pylori serology.    Plan 1) as above, continue octreotide drip and clears liquids no red no carbonation. daily cbc. following until Dr Dionne Milo returns monday.   Electronic  Signatures: Loistine Simas (MD)  (Signed 09-Mar-13 17:32)  Authored: Chief Complaint, VITAL SIGNS/ANCILLARY NOTES, Brief Assessment, Lab Results, Assessment/Plan   Last Updated: 09-Mar-13 17:32 by Loistine Simas (MD)

## 2014-05-29 NOTE — Consult Note (Signed)
Impression: 81yo WM w/ h/o hypothyroidism admitted with duodenal ulcer with hemorrhage who has leukocytosis.  He has had repeated blood transfusion for persistent GI bleeding.  His endoscopy has shown several duodenal ulcers.  Currently his hgb is stable.  He denies any fever, chills or sweats.  There are no focal symptoms of an acute infectious process.  He has no infiltrate on CXR and cultures of the urine and blood have been negative. No signs of perforated ulcer. I suspect that his leukocytosis is due to the GI bleed.  The passage of blood in the GI tract can raise the WBC.  Would follow his CBC over time. No indication for antibiotics at this time.  Review of his WBC since 2010 have shown some elevations with some normal values.  Another possibility would be developing CML.  If he continues to show no signs of infection, but his WBC remains high, then this possibility should be explored.  There is no indication that this is AML and therefore there is no immediate need to start a work up for [facility].   Electronic Signatures: Laketha Leopard, Heinz Knuckles (MD) (Signed on 08-Mar-13 16:34)  Authored   Last Updated: 08-Mar-13 16:36 by Jibri Schriefer, Heinz Knuckles (MD)

## 2014-05-29 NOTE — Consult Note (Signed)
PATIENT NAME:  David Mcmillan, David Mcmillan MR#:  [id number] DATE OF BIRTH:  1932/11/05  DATE OF CONSULTATION:  04/12/2011  REFERRING PHYSICIAN:  Dr. Fritzi Mandes  CONSULTING PHYSICIAN:  Heinz Knuckles. Deigo Alonso, MD  REASON FOR CONSULTATION: Leukocytosis.   HISTORY OF PRESENT ILLNESS: Patient is a 81 year old white man with a past history significant for hypothyroidism who was admitted on 03/02 with anorexia and constipation. Patient is a very poor historian. When asked how he was doing he said he was fine. When I asked what brought him into the hospital he could not really say. When he was specifically questioned of whether he was having bleeding he did admit to having coughing up blood and to having blood from his rectum. When asked how long these symptoms have been going on he said about six months. Review of the history and physical did not reveal an exact timeframe of his symptoms. Patient was admitted to the hospital and found to be guaiac-positive. Subsequent work-up has demonstrated duodenal ulcers on EGD. At the time of his evaluation there was black gastric fluid present and a blood clot in the duodenal bulb overlying the ulcer. There is no active bleeding noted. He has received blood transfusions during his hospitalization but his hemoglobin has been stable over the last 24 hours or so. His white count on admission was 18.8 has gone down as low as 13.7 and then risen back up to 25.6 yesterday, today it was 21.2. He denies any fevers, chills or sweats. He has not had any headaches, sinus congestion, sore throat, swollen glands, neck stiffness, cough, shortness of breath or sputum production. He has had no abdominal pain. He has been having nausea and vomiting with coffee ground emesis as well as black stool. He denies any urinary complaints. Overall he is a fairly poor historian, however.   ALLERGIES: Percocet.   PAST MEDICAL HISTORY:  1. Hypothyroidism.  2. Hypertension.  3. 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  4. Melanoma.  5. Right hip fracture, status post ORIF.  6. Status post appendectomy.   SOCIAL HISTORY: Patient lives at home. He has a cat at home. He drinks heavily and smokes about half pack of cigarettes per day. No injecting drug use history.   FAMILY HISTORY: Positive for hypertension per the history and physical. The patient states that he has cancer in the family but can't be more specific.    REVIEW OF SYSTEMS: GENERAL: No fevers, chills, or sweats. Positive weakness that is improving. He is currently working with physical therapy. HEENT: No headaches. No sinus congestion. No sore throat. NECK: No stiffness. No swollen glands. RESPIRATORY: No cough. No shortness of breath. No sputum production. CARDIAC: No chest pains or palpitations. No peripheral edema. GASTROINTESTINAL: Positive nausea and vomiting with coffee ground emesis. No significant abdominal pain. Positive melena but no diarrhea. He has had constipation prior to his admission. GENITOURINARY: No complaints. MUSCULOSKELETAL: No complaints. SKIN: No rashes. NEUROLOGIC: No focal weakness. PSYCHIATRIC: No complaints. All other systems are negative.   PHYSICAL EXAMINATION:  VITAL SIGNS: T-max 99.8, T-current 98.1, pulse 85, blood pressure 145/89, 96% on 2 liters.   GENERAL: 81 year old white man mildly confused but in no acute distress.   HEENT: Normocephalic, atraumatic. Pupils are equal and reactive to light. Extraocular motion intact. Sclerae, conjunctivae, and lids are without evidence for emboli or petechiae. His conjunctivae were moderately pale. Oropharynx shows no erythema or exudate. Gums are in fair condition.   NECK: Supple. Full range of motion.  Midline trachea. No lymphadenopathy. No thyromegaly.   CHEST: Clear to auscultation bilaterally with good air movement. No focal consolidation.   CARDIAC: Regular rate and rhythm without murmur, rub, or gallop.   ABDOMEN: Soft, nontender, nondistended. No  hepatosplenomegaly. No hernias noted.   EXTREMITIES: No evidence for tenosynovitis.   SKIN: He is somewhat pale but no rashes were appreciated. No stigmata of endocarditis present, specifically no Janeway lesions nor Osler nodes.   NEUROLOGIC: The patient was awake and interactive. He was a moderately confused but able to answer questions to some extent. He was not able to provide a coherent history of what was going on at home prior to his admission.   PSYCHIATRIC: Mood and affect appeared pleasant.   LABORATORY, DIAGNOSTIC, AND RADIOLOGICAL DATA: BUN 43, creatinine 0.83 from yesterday. AST 90, ALT 77, alkaline phosphatase 76, total bilirubin 0.4. White count today 21.2, no differential was obtained. White count from yesterday was 25.6. Hemoglobin today 8.3. Blood cultures from yesterday show no growth. A urine culture shows no growth. There was no corresponding urinalysis. A urinalysis from 04/06/2011 had 1+ blood, negative nitrates, negative leukocyte esterase, 5 red cells and less than 1 white cell per high-power field. Urine culture grew mixed bacterial organisms. Helicobacter pylori IgG who was negative. A chest x-ray from admission shows a possible right hilar mass although the chest was rotated. A CT scan of the chest, abdomen, and pelvis without contrast showed mild basilar atelectasis, distention of the renal bulb and duodenal wall thickening, mild gastric distention, mild gallbladder distention. There was a large amount of stool noted throughout the colon particularly in the rectum. There was no note of any pulmonary mass. Bilateral lower extremity ultrasound shows no evidence for deep vein thrombosis.   IMPRESSION: 81 year old white man with a history of hypothyroidism admitted with duodenal ulcer with hemorrhage with leukocytosis.   RECOMMENDATIONS:  1. He has had repeat blood transfusions for persistent gastrointestinal bleeding. His endoscopy has shown several duodenal ulcers. Currently  his hemoglobin has been stable. He denies any fevers, chills or sweats. There are no focal symptoms of an acute infectious process. He has no infiltrate on chest x-ray and cultures of the urine and blood have been negative.  2. No signs of a perforated ulcer.  3. I suspect that his leukocytosis is due to the GI bleed. The passage of blood in the gastrointestinal tract can raise the white blood count. Would follow his CBC over time.  4. There is no indication for antibiotics at this time.  5. Review of his white count since 2010 have shown some elevations with some normal values. Another possibility for his leukocytosis would be developing CML. If he continues to show no signs of infection, but his white count remains high than this possibility should be explored. There is no indication at this time for acute myelogenous leukemia and therefore there is no immediate need to start  work-up for CML until we see what happens with his white count.   This is a moderately complex infectious disease case. Thank you very much for involving me in Mr. Brumett care.  ____________________________ Heinz Knuckles. Alexes Lamarque, MD meb:cms D: 04/12/2011 16:46:38 ET T: 04/12/2011 17:14:05 ET JOB#: [id number]  cc: Heinz Knuckles. Chiffon Kittleson, MD, <Dictator> Kourosh Jablonsky E Rayden Scheper MD ELECTRONICALLY SIGNED 04/16/2011 10:10

## 2014-05-29 NOTE — Consult Note (Signed)
Chief Complaint:   Subjective/Chief Complaint Case discussed wth Dr. Manuella Ghazi and his nurse. No vomiting or melena. Vitals stable. H and H improved.  Impression: Recent significant GI bleed from a duodenal ulcer s/p EGD with thermal therapy with good results. No signs of active bleeding.  Recommendations: Advance to full liquids. Decrease Sandostatin to 25 mics per hour. Change Protonix to IV BID due to problems with IV access. Repeat CBC in am.   VITAL SIGNS/ANCILLARY NOTES: **Vital Signs.:   05-Mar-13 04:42   Vital Signs Type Routine   Temperature Temperature (F) 97.9   Celsius 36.6   Temperature Source axillary   Pulse Pulse 83   Pulse source per Dinamap   Respirations Respirations 20   Systolic BP Systolic BP 867   Diastolic BP (mmHg) Diastolic BP (mmHg) 88   Mean BP 104   BP Source Dinamap   Pulse Ox % Pulse Ox % 92   Pulse Ox Activity Level  At rest   Oxygen Delivery 2L   Routine Hem:  05-Mar-13 05:13    Hemoglobin (CBC) 8.9   Electronic Signatures: Jill Side (MD)  (Signed 05-Mar-13 10:22)  Authored: Chief Complaint, VITAL SIGNS/ANCILLARY NOTES, Lab Results   Last Updated: 05-Mar-13 10:22 by Jill Side (MD)

## 2014-05-29 NOTE — Consult Note (Signed)
Chief Complaint:   Subjective/Chief Complaint Complaining of leg pain. Somewhat disoriented. No melena in last several hours. Hemoglobin at 8.2 after PRBC transfusion. HR fluctuates between 80 and 130 and may be secondary to pain and agitation.  Impression: Patient with deep duocenal ulcer with a visible vessel s/p cautrization 4 days ago. His hemoglobin dropped yesterday and he did have some dark stool. H and H improved. No further melena. BP well maintained although HR fluctuates from beeing normal to up to 130, most likely secondary to agitation rather than volume depletion.  Recommendations: Will repeat H and H now. Continue IV PPI and Octreotide. Will decide about repeat EGD based on repeat H and H and presence or abscence of any further melena. Repeat EGD with sedation is not without risk due to his overall condition and episodes of tachycardia. On the other hand if there are clear signs of active bleeding, repeat EGD may be helpfull. Will follow closely.   VITAL SIGNS/ANCILLARY NOTES: **Vital Signs.:   08-Mar-13 05:43   Vital Signs Type Routine   Temperature Temperature (F) 98.1   Celsius 36.7   Temperature Source oral   Pulse Pulse 86   Pulse source per Dinamap   Respirations Respirations 20   Systolic BP Systolic BP 102   Diastolic BP (mmHg) Diastolic BP (mmHg) 81   Mean BP 101   BP Source Dinamap   Pulse Ox % Pulse Ox % 96   Pulse Ox Activity Level  At rest   Oxygen Delivery 2L   Routine Hem:  08-Mar-13 05:26    WBC (CBC) 21.2   Electronic Signatures: Jill Side (MD)  (Signed 08-Mar-13 08:52)  Authored: Chief Complaint, VITAL SIGNS/ANCILLARY NOTES, Lab Results   Last Updated: 08-Mar-13 08:52 by Jill Side (MD)

## 2014-05-29 NOTE — Discharge Summary (Signed)
PATIENT NAME:  David Mcmillan, David Mcmillan MR#:  [id number] DATE OF BIRTH:  Jun 12, 1932  DATE OF ADMISSION:  04/06/2011 DATE OF DISCHARGE:  04/16/2011  PRIMARY CARE PHYSICIAN: Dr. Lelon Huh    PRESENTING COMPLAINT: GI bleed.   DISCHARGE DIAGNOSES:  1. Acute posthemorrhagic anemia secondary to upper gastrointestinal bleed presumed due to duodenal ulcer, non-steroidal anti-inflammatory medication induced status post 5 units total blood transfusion.  2. Acute renal failure, resolved.  3. Hypertension, stable. Patient is off medicine.  4. Elevated white count without evidence of any infection, fever work-up negative, presumed due to gastrointestinal bleed.  5. Dehydration, resolved.  6. Alcohol abuse. Patient was on CIWA.   7. Chronic obstructive pulmonary disease exacerbation, stable, improved.  8. Chronic pain with narcotic dependence.  9. Degenerative joint disease with back pain.   CODE STATUS: FULL CODE.   CONDITION ON DISCHARGE: Fair. Long-term prognosis poor. Blood pressure 132/75, sats 91% to 96% on room air.   LABORATORY, DIAGNOSTIC AND RADIOLOGICAL DATA: White count 16.0, hemoglobin and hematocrit 9.9 and 30.1, platelet count 263. Helicobacter serology negative.   Creatinine 0.73, BUN 17, glucose 136, sodium 136, potassium 3.4. Ultrasound Doppler lower extremity negative for deep venous thrombosis. Left knee x-ray shows degenerative joint disease changes.   Blood cultures negative in 48 hours. SGOT 90, SGPT 77, albumin 2.5.   CONSULTATIONS:  1. Dr. Dionne Milo.  2. Dr. Clayborn Bigness.   PROCEDURES:  1. Upper GI endoscopy 04/07/2011 showed LA grade D one or more mucosal breaks involving at least 75% of esophageal circumference, esophagitis with no bleeding was found, 35 to 40 cm from the incisors. Black fluid was found in the gastric fundus, about 1000 mL aspirated. A large adherent clot was found in the duodenal bulb. Multiple attempts to remove the clot failed. No active bleeding was noted  throughout the procedure.  2. Repeat endoscopy done 04/08/2011 showed LA grade D reflux esophagitis. Moderate Schatzki's ring. Normal stomach. One duodenal ulcer containing visible vessel injected and coagulation for hemostasis using heater probe was successful.   BRIEF SUMMARY OF HOSPITAL COURSE: David Mcmillan is a 81 year old Caucasian gentleman with past medical history of chronic pain syndrome on narcotics along with non-steroidal anti-inflammatory medications, history of hypertension, chronic obstructive pulmonary disease and depression comes to the Emergency Room with:  1. Acute posthemorrhagic anemia which was suspected due to upper GI bleed likely from the duodenal ulcer caused by NSAID use. Patient takes meloxicam and other NSAIDs on a daily basis secondary to his chronic pain and back pain. He is status post total 5 unit blood transfusion during the hospital stay. He underwent EGD x2 along with cauterization of bleeder with large duodenal ulcer. Patient was on octreotide drip and PPI drip which were tapered off prior to discharge. He is currently on PPI p.o. b.i.d. Will follow up with Dr. Dionne Milo as outpatient. Patient was seen by Dr. Dionne Milo mainly from GI standpoint and Dr. Gustavo Lah cross covering for Dr. Dionne Milo on the weekends.   2. Acute renal failure, resolved IV fluids received at admission.  3. Elevated white count. Patient's white cell count remained between 16,000 to 26,000 with negative fever work-up, negative cultures. Infectious disease consultation was obtained with Dr. Clayborn Bigness and after all the work-up remained negative was presumed due to GI bleed. His white count on discharge was 16,000.  4. Dehydration, resolved. Clear liquids were started after patient's GI bleed was contained and is tolerating regular soft diet prior to discharge.  5. Alcohol abuse with elevated mild LFTs  with alcoholic-induced hepatitis. Patient was on CIWA protocol. No signs of withdrawal noted. CIWA was  discontinued. Patient is advised to stay away from alcohol.  6. Chronic obstructive pulmonary disease flare in the setting of anemia, improved, stable. Sats are 98% on room air.  7. Bilateral lower extremity cramps and legs pains with chronic pain syndrome. Patient's left knee showed chronic degenerative joint disease changes. Patient has narcotic dependence and was asking frequently for pain medications and family does not agree with patient's narcotic dependence for more than two years. He was kept on his Percocet at home and p.r.n. short-acting morphine.  8. Generalized weakness. Patient was seen by physical therapy, recommended STR which has been arranged. He is going to be discharged to [facility] today.  [facility] stay otherwise remained prolonged, however, stable. Patient remained a FULL CODE.    TIME SPENT: 40 minutes.   ____________________________ Hart Rochester Posey Pronto, MD sap:cms D: 04/16/2011 11:07:20 ET T: 04/16/2011 11:33:56 ET JOB#: [id number]  cc: Mindee Robledo A. Posey Pronto, MD, <Dictator> Kirstie Peri. Caryn Section, MD Jill Side, MD Prentiss Blocker, MD Ilda Basset MD ELECTRONICALLY SIGNED 04/18/2011 16:05

## 2014-05-29 NOTE — Consult Note (Signed)
Chief Complaint:   Subjective/Chief Complaint EGD repeated. The duodenal clot has moved exposing a large and deep duocenal ulcer with a large central vessel. The area around the ulcer was injected with 1:10,000 epinephrine and the vessel cautrized with a heater probe at 30 joules with good results. Severe esophagitis and a Schatzki's ring was again noted and partially dilated with scope. Patient tolerated the procedure well.  Recommendations: Follow H and H. Continue IV PPI and Octreotide. If no active bleeding, will start to taper Octreotide in am. Clear liquid diet. Will follow.   Electronic Signatures: Jill Side (MD)  (Signed 04-Mar-13 17:14)  Authored: Chief Complaint   Last Updated: 04-Mar-13 17:14 by Jill Side (MD)

## 2014-05-29 NOTE — Consult Note (Signed)
Chief Complaint:   Subjective/Chief Complaint EGD showed severe esophagitis but no varices. Copious amount of dark liquid in the stomach, aspirated. A large tightly adharent blood clot in duodenal bulb raising concerns about possible underlying ulcer was seen. The clot could not be dislodged despite extensive efforts. No active bleeding was noted throughout the procedure.  Recommendations: Continue PPI and Octreotide drips. Follow H and H. Will repeat EGD tomrrow for a second look and hopefully the clot would have moved by then so a defenitive therapy can be given if needed. Reglan 5 mg IV q 6 hours.   VITAL SIGNS/ANCILLARY NOTES: **Vital Signs.:   03-Mar-13 07:59   Vital Signs Type Routine   Temperature Temperature (F) 98.9   Celsius 37.1   Temperature Source oral   Pulse Pulse 113   Pulse source per Telemetry Clerk   Respirations Respirations 16   Systolic BP Systolic BP 378   Diastolic BP (mmHg) Diastolic BP (mmHg) 69   Mean BP 86   BP Source Dinamap   Pulse Ox % Pulse Ox % 100   Pulse Ox Activity Level  At rest   Oxygen Delivery 3L   Routine Hem:  03-Mar-13 04:20    WBC (CBC) 18.4   RBC (CBC) 2.79   Hemoglobin (CBC) 9.1   Hematocrit (CBC) 26.9   Platelet Count (CBC) 237   MCV 96   MCH 32.6   MCHC 33.8   RDW 19.6  Routine Chem:  03-Mar-13 04:20    Glucose, Serum 133   BUN 53   Creatinine (comp) 1.11   Sodium, Serum 138   Potassium, Serum 3.2   Chloride, Serum 92   CO2, Serum 30   Calcium (Total), Serum 7.9   Osmolality (calc) 292   eGFR (African American) >60   eGFR (Non-African American) >60   Anion Gap 16  Routine Hem:  03-Mar-13 04:20    Neutrophil % 95.5   Lymphocyte % 2.0   Monocyte % 2.5   Eosinophil % 0.0   Basophil % 0.0   Neutrophil # 17.6   Lymphocyte # 0.4   Monocyte # 0.5   Eosinophil # 0.0   Basophil # 0.0   Electronic Signatures: Jill Side (MD)  (Signed 03-Mar-13 10:33)  Authored: Chief Complaint, VITAL SIGNS/ANCILLARY  NOTES, Lab Results   Last Updated: 03-Mar-13 10:33 by Jill Side (MD)

## 2014-05-29 NOTE — Consult Note (Signed)
PATIENT NAME:  David Mcmillan, David Mcmillan MR#:  [id number] DATE OF BIRTH:  08-03-1932  DATE OF CONSULTATION:  04/09/2011  REFERRING PHYSICIAN:  Mena Pauls, MD CONSULTING PHYSICIAN:  Drue Stager. [place], MD  REASON FOR CONSULTATION: Agitation, anxiety, alcohol dependence, and depression.   HISTORY OF PRESENT ILLNESS: David Mcmillan is a 81 year old married male admitted to the [facility] on 04/06/2011 with an upper GI bleed and hemoglobin down to 7.7.   David Mcmillan developed confusion and clouding of consciousness with agitation and thought disorganization. He also experienced severe feeling on edge. He has been drinking excessively at home, an unknown amount of wine, probably daily. With his memory returning as of today, he recalls that he has not been eating well also.   He has been receiving thiamine and his memory function has returned to almost normal today. Also his orientation function is almost completely normal. He is able to cooperate with bedside care normally. He did undergo EGD with thermal therapy with good results with no active signs of bleeding.   He does have a history of depression and has been experiencing recent stresses including having to move his wife into an assisted living facility. He has been living at home without any pets. His son lives away from him. He has been maintained on Paxil 30 mg daily and Effexor 75 mg daily.   The duration of his period of confusion and agitation was approximately one week with progressive symptoms, now resolved.   As of today David Mcmillan describes normal interests as well as hope. He is not having any thoughts of harming himself or others. He has no hallucinations or delusions. His mood is normal. His energy is still decreased and his concentration is mildly decreased. Please see the mental status examination.  PAST PSYCHIATRIC HISTORY: David Mcmillan does have a record of Effexor 75 mg daily in 2010. The difference in his  medication list currently is that Paxil 30 mg daily is on the list, in addition to Effexor 75 mg daily.   He does have a history of excessive worry as well as feeling on edge and decreased concentration. In December of 2010, he was on Cymbalta 60 mg daily as well as Effexor 75 mg daily   He does have a history of periods of greater than two weeks involving depressed mood, decreased energy, and decreased concentration.   FAMILY PSYCHIATRIC HISTORY: None known.  SOCIAL HISTORY: David Mcmillan is married. Please see the above discussion. He has one son and one grandson. He lives alone. He has been drinking an unknown amount of wine per day as an outpatient. David Mcmillan son lives in [place], [place].  OCCUPATION: Retired. He does not use any illegal drugs.   PAST MEDICAL HISTORY:  1. Hypertension. 2. Hypothyroidism.  3. History of chronic hip pain on the right side taking oxycodone and meloxicam.  4. Glaucoma. 5. History of parotid cancer status post left surgery. 6. Superficial melanoma. 7. History of right hip fracture and ORIF.  8. History of appendectomy.  DRUG ALLERGIES: There was one listed, Percocet; however, the patient has been taking oxycodone at home.  MEDICATIONS: His MAR is reviewed. His psychotropic medications include the following.  1. Ativan 0.5 mg every 8 hours p.r.n. anxiety. 2. Paxil 30 mg daily. 3. Venlafaxine XR 75 mg daily.  4. Thiamine 100 mg daily. 5. He is on a prednisone taper as part of his general medications.  6. He has required some Ativan today. He received  2 mg twice.   LABORATORY DATA: Hemoglobin is currently 8.9, platelet count 167, and WBC 13.7. Magnesium is normal. BUN is 39 and creatinine 0.84. Hemoglobin A1c is normal at 5.7. Folate was normal. The patient had a normal ferritin at 74. B12 was unremarkable. SGOT 19 and SGPT 13. Albumin was decreased at 2.8. Total protein was decreased at 5.8.   EKG: QTc at 396 ms.   REVIEW OF SYSTEMS:  Constitutional, head, eyes, ears, nose, throat, mouth, neurologic, psychiatric, cardiovascular, respiratory, gastrointestinal, genitourinary, skin, musculoskeletal, hematologic, lymphatic, endocrine, and metabolic all unremarkable.   PHYSICAL EXAMINATION:   VITAL SIGNS: Temperature 97.6, pulse 86, respiratory rate 20, blood pressure 152/94.   GENERAL APPEARANCE: David Mcmillan is an elderly male lying in a supine position in his hospital bed with no abnormal involuntary movements. He has no cachexia. There is some edema present in his left forearm. He does have overall decreased muscle tone, although not cachectic. He does have normal grooming and hygiene.   MENTAL STATUS EXAMINATION: David Mcmillan is alert with good eye contact. His concentration is mildly decreased. On orientation testing, he does know the year and the month. He misses the day of the month by two. He knows the day of the week. He also knows place and person. On memory testing he names three out of three immediate and two out of three words at 5 minutes. His memory is intact to recent and remote except for a blackout period approximately lasting from 3 to 4 days ago to yesterday. His fund of knowledge, intelligence, and use of language are mildly below that of his estimated premorbid baseline. He does have the capacity for abstraction. Speech is slightly slowed with a slightly flat prosody. There is no dysarthria. Thought process is slightly slowed, however, it is logical, coherent, and goal-directed. There are no looseness of associations or tangents. Thought content - he does have hope. He expresses normal interests. He does not have any thoughts of harming himself or others. He has no delusions or hallucinations. His affect is mildly constricted. Mood is normal. Insight is intact for his alcohol dependence. His judgment is intact for informed consent regarding his psychiatric treatment.   ASSESSMENT:   AXIS I:  1. Generalized anxiety  disorder.  2. Major depressive disorder.  3. Alcohol dependence.   AXIS II: None.   AXIS III:  Recent GI bleed, see past medical history.   AXIS IV: General, medical, primary support group.   AXIS V: 50.   David Mcmillan is not at risk to harm himself or others. He agrees to call emergency services for any thoughts of harming himself, thoughts of harming others, or distress.   David Mcmillan could still be at risk to wax and wane regarding delirium symptoms given his current hemoglobin and the age of his central nervous system; the propensity to enter delirium would be greater in the p.m. of the day.   RECOMMENDATIONS:  Given that he is not having psychotic symptoms would not expose him to the risk of a neuroleptic for anti-agitation. I would continue to utilize the Ativan p.r.n. for periods of agitation or severe anxiety.   I would taper him off his venlafaxine given that the venlafaxine's presence has been evident over a number of years in the medical record while other antidepressants were changed. This indicates that venlafaxine has had no significant effect on anti-depression at 75 mg per day. It is mainly a mild serotonin reuptake inhibitor. He is on a potent dose  of Paxil for his age, as a serotonin reuptake inhibitor.   Therefore, the rationale is to try off venlafaxine in order to minimize unnecessary medications.   I would continue regular thiamine as an outpatient, 50 to 100 mg p.o. daily.   Alcoholic's Anonymous was discussed with the patient. He is familiar with the basic concepts and is interested in the 12 steps. He also is interested in a program to help with structured alcohol rehabilitation. He is considering the possibility of entering a residential chemical dependence treatment program after discharge. He is not sure yet.   See the discussion above. Continue Paxil 30 mg daily. I would taper off of venlafaxine by 25 mg every three days. David Mcmillan can follow up at the  undersigned's office [phone]) when he is finished with hospitalization and residential treatment.   Please contact the psychiatric intake nurse in the emergency department for facilitation of a chemical dependence residential program, if the patient desires. ____________________________ Drue Stager. Derreck Wiltsey, MD [email or web address] D: 04/09/2011 14:05:35 ET T: 04/09/2011 15:40:16 ET JOB#: [id number]  cc: Drue Stager. Carrington Mullenax, MD, <Dictator> Billie Ruddy MD ELECTRONICALLY SIGNED 04/11/2011 22:54

## 2014-05-29 NOTE — Consult Note (Signed)
   (  Removed):   Assessment/Plan:     Electronic Signatures: Jill Side (MD)  (Signed 03-Mar-13 09:32)  Authored: Chief Complaint, VITAL SIGNS/ANCILLARY NOTES, Brief Assessment, Lab Results, Assessment/Plan   Last Updated: 03-Mar-13 09:32 by Jill Side (MD)

## 2014-05-29 NOTE — Consult Note (Signed)
Chief Complaint:   Subjective/Chief Complaint Feels better and stronger. No signs of active bleeding.   VITAL SIGNS/ANCILLARY NOTES: **Vital Signs.:   06-Mar-13 09:09   Vital Signs Type Q 4hr   Temperature Source oral   Pulse Pulse 114   Pulse source per Dinamap   Respirations Respirations 20   Systolic BP Systolic BP 130   Diastolic BP (mmHg) Diastolic BP (mmHg) 84   Mean BP 96   BP Source Dinamap   Pulse Ox % Pulse Ox % 96   Pulse Ox Activity Level  At rest   Oxygen Delivery 2L   Routine Hem:  06-Mar-13 05:26    WBC (CBC) 14.5   RBC (CBC) 2.87   Hemoglobin (CBC) 9.2   Hematocrit (CBC) 27.7   Platelet Count (CBC) 184   MCV 96   MCH 31.9   MCHC 33.1   RDW 19.5   Neutrophil % 91.7   Lymphocyte % 1.6   Monocyte % 6.7   Eosinophil % 0.0   Basophil % 0.0   Neutrophil # 13.3   Lymphocyte # 0.2   Monocyte # 1.0   Eosinophil # 0.0   Basophil # 0.0   Assessment/Plan:  Assessment/Plan:   Assessment Upper GI bleed secondary to a large duodenal ulcer with visible vessel s/p thermal therapy. No signs of active bleeding and H and H is satble.    Plan Continue full liquid diet. May change to soft in am if no signs of active bleeding. I will DV Octreotide. I have ordered H. pylori antibody for am. Probable DC on Friday on bid PPI and follow up with me in 2 weeks.   Electronic Signatures: Jill Side (MD)  (Signed 06-Mar-13 11:12)  Authored: Chief Complaint, VITAL SIGNS/ANCILLARY NOTES, Lab Results, Assessment/Plan   Last Updated: 06-Mar-13 11:12 by Jill Side (MD)

## 2014-05-29 NOTE — Consult Note (Signed)
Chief Complaint:   Subjective/Chief Complaint doing well, denies abdominal pain, no evidence of recurrent gi bleeding.  tolerating clears.   VITAL SIGNS/ANCILLARY NOTES: **Vital Signs.:   10-Mar-13 09:51   Vital Signs Type Q 4hr   Temperature Temperature (F) 97.8   Celsius 36.5   Temperature Source oral   Pulse Pulse 97   Respirations Respirations 18   Systolic BP Systolic BP 045   Diastolic BP (mmHg) Diastolic BP (mmHg) 85   Mean BP 104   BP Source Dinamap   Pulse Ox % Pulse Ox % 98   Brief Assessment:   Cardiac Irregular    Respiratory clear BS    Gastrointestinal details normal Soft  Nontender  Nondistended  No masses palpable  Bowel sounds normal   Routine Hem:  07-Mar-13 21:13    Hemoglobin (CBC) 8.2  08-Mar-13 09:41    Hemoglobin (CBC) 8.3  09-Mar-13 04:20    Hemoglobin (CBC) 7.9  10-Mar-13 05:04    Hemoglobin (CBC) 9.1   Assessment/Plan:  Assessment/Plan:   Assessment 1) upper gi bleeding due to duodenal ulcer.  No evidence of recurrent bleeding.    Plan 1) will advance diet to full liquids.  continue ppi drip, and begin to taper octreotide tomorrow to 25 mcg per hour for 24 hours then d/c. Dr Dionne Milo to return tomorrow.   Electronic Signatures: Loistine Simas (MD)  (Signed 10-Mar-13 15:32)  Authored: Chief Complaint, VITAL SIGNS/ANCILLARY NOTES, Brief Assessment, Lab Results, Assessment/Plan   Last Updated: 10-Mar-13 15:32 by Loistine Simas (MD)

## 2014-05-29 NOTE — Consult Note (Signed)
See Dictation.  Electronic Signatures: Billie Ruddy (MD)  (Signed on 05-Mar-13 14:13)  Authored  Last Updated: 05-Mar-13 14:13 by Billie Ruddy (MD)

## 2014-05-29 NOTE — Consult Note (Signed)
Chief Complaint:   Subjective/Chief Complaint Feels much better. No BM's in 2 days.   VITAL SIGNS/ANCILLARY NOTES: **Vital Signs.:   11-Mar-13 18:16   Vital Signs Type Q 4hr   Temperature Temperature (F) 97.8   Celsius 36.5   Temperature Source oral   Pulse Pulse 98   Pulse source per Dinamap   Respirations Respirations 18   Systolic BP Systolic BP 903   Diastolic BP (mmHg) Diastolic BP (mmHg) 83   Mean BP 100   BP Source Dinamap   Pulse Ox % Pulse Ox % 95   Pulse Ox Activity Level  At rest   Oxygen Delivery Room Air/ 21 %   Routine Hem:  11-Mar-13 06:22    WBC (CBC) 16.0   RBC (CBC) 3.23   Hemoglobin (CBC) 9.9   Hematocrit (CBC) 30.1   Platelet Count (CBC) 263   MCV 93   MCH 30.5   MCHC 32.7   RDW 20.5   Neutrophil % 81.2   Lymphocyte % 6.6   Monocyte % 11.2   Eosinophil % 0.9   Basophil % 0.1   Neutrophil # 13.0   Lymphocyte # 1.0   Monocyte # 1.8   Eosinophil # 0.1   Basophil # 0.0   Assessment/Plan:  Assessment/Plan:   Assessment Recent upper GI bleed from a duodenal ulcer. No signs of active GI bleed and H and H is stable. H. pylori negative.    Plan Agree with current plan. May DC Sandostatin in am if no active bleeding. Please continue PPI bid on discharge and patient to follow up with me in 4 weeks.   Electronic Signatures: Jill Side (MD)  (Signed 11-Mar-13 19:15)  Authored: Chief Complaint, VITAL SIGNS/ANCILLARY NOTES, Lab Results, Assessment/Plan   Last Updated: 11-Mar-13 19:15 by Jill Side (MD)

## 2014-05-29 NOTE — Consult Note (Signed)
Chief Complaint:   Subjective/Chief Complaint Patient's hemoglobin dropped to 7.4 from 9.2. He has had few small dark BM's today. HR is fluctuating but he is maintaining his BP well. Claims to be feeling OK. No vomiting.   VITAL SIGNS/ANCILLARY NOTES: **Vital Signs.:   07-Mar-13 10:40   Vital Signs Type Routine   Temperature Temperature (F) 98   Celsius 36.6   Temperature Source oral   Pulse Pulse 80   Pulse source per Dinamap   Respirations Respirations 20   Systolic BP Systolic BP 767   Diastolic BP (mmHg) Diastolic BP (mmHg) 90   Mean BP 107   Pulse Ox % Pulse Ox % 93   Pulse Ox Activity Level  At rest   Oxygen Delivery 2L   Brief Assessment:   Additional Physical Exam Appears pale.   Routine Hem:  07-Mar-13 05:35    WBC (CBC) 25.6   RBC (CBC) 2.33   Hemoglobin (CBC) 7.4   Hematocrit (CBC) 22.4   Platelet Count (CBC) 247   MCV 96   MCH 31.7   MCHC 33.0   RDW 20.1   Segmented Neutrophils 76   Lymphocytes 11   Monocytes 13   Assessment/Plan:  Assessment/Plan:   Assessment Patient with recent GI bleed secondary to a large duodenal ulcer with a visible vessel. The vessel was treated with thermal therapy with good results. The current clinical scenario is concerning for re-bleeding although some of the changes may be due to recent blood loss raher than active bleeding. Worsening leucocytosis. ? Etiology.    Plan Will change back to PPI drip as well as start on Octreotide drip. 2 units PRBC transfusions. I will suggest repeat EGD after stabilization, preferably in am for possible retreatment. Plan discussed with Dr. Posey Pronto.   Electronic Signatures: Jill Side (MD)  (Signed 07-Mar-13 12:44)  Authored: Chief Complaint, VITAL SIGNS/ANCILLARY NOTES, Brief Assessment, Lab Results, Assessment/Plan   Last Updated: 07-Mar-13 12:44 by Jill Side (MD)

## 2014-05-29 NOTE — Consult Note (Signed)
Brief Consult Note: Diagnosis: GI bleed.   Patient was seen by consultant.   Orders entered.   Discussed with Attending MD.   Comments: Patient with coffee ground emesis and severe anemia as well as hypotension, dehydration and acute renal failure. CBC and iron studies suggest probable chronic anemia in addition to acute blood loss anemia. Patient also has leucocytosis and CT suggests duodenal edema raising concerns about possible duodenal ulcer.  Recommendations: Transfuse PRBC as planned. Follow H and H. IV PPI. NPO except ice chips. Once more stable, will proceed with an EGD. The procedure discussed with the patient and he is in full agreement.  Electronic Signatures: Jill Side (MD)  (Signed 02-Mar-13 19:48)  Authored: Brief Consult Note   Last Updated: 02-Mar-13 19:48 by Jill Side (MD)

## 2014-05-29 NOTE — H&P (Signed)
PATIENT NAME:  David Mcmillan, David Mcmillan MR#:  [id number] DATE OF BIRTH:  1932-08-25  DATE OF ADMISSION:  04/06/2011  PRIMARY CARE PHYSICIAN: Lelon Huh, MD   CHIEF COMPLAINT: I did not have a bowel movement for 11 days. I have not eaten for 11 days.   HISTORY OF PRESENT ILLNESS: The patient is a 81 year old male who has history of hypertension, hypothyroidism, also history of chronic right hip pain for which he takes oxycodone and meloxicam. Also, he drinks wine every day about one-half bottle of wine every day. He was asked by his primary care physician to come to the Emergency Room yesterday. He had recent blood work done and they said that his sodium was low and his white count was elevated but he did not come to the Emergency Room. His son lives in [place], [place] who came to see him today and he called EMS to be brought to the hospital. The patient has been vomiting at home. He had vomiting in the Emergency Room also, dark-colored black vomiting, coffee-ground emesis, some occasional blood in the vomitus. NG tube was inserted in the Emergency Room. He also had some diarrhea last night and this morning. There was some question of fecal impaction in the Emergency Room so he got 20 mg of Dulcolax suppository in the Emergency Room. He had a bowel movement in the Emergency Room. Guaiac was positive as per the Emergency Room physician. He got 8 mg of Zofran, Phenergan, and 40 mg of IV Protonix in the Emergency Room. His blood pressure was 80/56 when he initially came to the Emergency Room. He got about 2 liters of normal saline bolus. His blood pressure has improved to 993 systolic. He was also wheezing bilaterally when he came to the Emergency Room. His initial work-up in the Emergency Room showed is hemoglobin is down to 7.7, white count of 18.8. His sodium is down to 132. His BUN is 58 and creatinine 1.6. Hospitalist was asked to admit the patient because of suspected upper GI bleed with anemia with hypotension  and possible duodenitis. Also as per the family members, the patient may not be taking his pain medications appropriately. The patient says that he takes oxycodone 7.5/325 about 5 tablets a day. The patient does not ambulate much. He denies any frequent falls.   REVIEW OF SYSTEMS: Positive for weakness. He denies any acute change in vision. No headache. He has a cough. He denies shortness of breath. He denies chest pain. He is having nausea and vomiting. He denies any abdominal pain. He is complaining of constipation but some bowel movement since yesterday. He denies any dysuria or frequency. He has thyroid problems. No history of anemia. No rash. He is complaining of chronic hip pain more on the right side. No focal numbness or weakness. He has some anxiety/depression.   PAST MEDICAL HISTORY:  1. Hypertension. 2. Hypothyroidism. 3. Glaucoma. 4. 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 5. Superficial melanoma.  6. He was last admitted here in December 2010 because of a right hip fracture status post ORIF.   7. Status post appendectomy.   ALLERGIES TO MEDICATIONS: Listed as Percocet but the patient has been taking oxycodone at home.   HOME MEDICATIONS WHICH I HAVE REVIEWED: 1. Oxycodone/acetaminophen 7.5/325 q.3 hours p.r.n.  2. Ambien 10 mg at bedtime. 3. Ativan 1 mg 3 times a day as needed. 4. Bisacodyl 5 mg once a day. 5. Colace 100 mg b.i.d.   6. Effexor 75 mg  daily. 7. Finasteride 5 mg daily. 8. Lotrel 5/40 daily.  9. Meloxicam 15 mg daily. 10. Restoril 15 mg at bedtime. 11. Synthroid 88 mcg daily.  12. Flomax 0.4 mg daily. 13. Xalatan ophthalmic solution one drop to each affected eye twice a day.  14. Paroxetine 30 mg daily.   SOCIAL HISTORY: He lives by himself in independent living. He smokes about half a pack per day. He drinks about half a bottle of wine every day. No drug use.   FAMILY HISTORY: Positive for hypertension as per previous records.       PHYSICAL EXAMINATION:  VITAL SIGNS: When he presented to the Emergency Room, temperature 98.6, heart rate 97, respiratory rate 22, blood pressure 80/56, saturating 90% on room air when he came in initially. Currently his heart rate is 107 to 120, blood pressure 134/70.   GENERAL: This is an elderly Caucasian male, thin built, sick looking, appears wasted in appearance.   HEENT: Pupils are equal. Extraocular muscles intact. No scleral icterus. No conjunctivitis. Oral mucosa is pale, dry.   NECK: No thyroid tenderness, enlargement, or nodule. Neck is supple. No masses, nontender. No adenopathy. No JVD. No carotid bruit.   CHEST: Bilateral breath sounds. Bilateral wheezing is present. Normal respiratory effort. Not using accessory muscles of respiration.   HEART: Heart sounds are irregular. No murmur. Peripheral pulses 1+. No lower extremity edema.   ABDOMEN: Abdomen appears to be slightly distended but nontender. Good bowel sounds. No hepatosplenomegaly. No bruit. No masses appreciable.   RECTAL: As per the ER physician, guaiac-positive stools. He has an NG tube in.   NEUROLOGIC: He is awake. He is alert. He is oriented to time, place, and person. Cranial nerves are intact. Moving all extremities against gravity. Difficulty moving his lower extremities because of hip pain. No cyanosis. No clubbing.   SKIN: Appears to be poor skin turgor.   LABORATORY, DIAGNOSTIC, AND RADIOLOGICAL DATA: White count 18.8, hemoglobin 7.7, platelet count 291,000, MCV 105. His last hemoglobin was 10.9 in December of 2010. BMP sodium 132, potassium 3.8, BUN 58, creatinine 1.6, glucose 101. His last creatinine was normal in 2010. His albumin is 2.8. Lipase 60. Magnesium is 2.5. His INR is normal at 1. His ABG shows pH of 7.52, pCO2 44, pO2 66 on room air. His CT of chest, abdomen, and pelvis showed mild basilar atelectasis, distention of the duodenal bulb and duodenal wall thickening, duodenitis, and large  amount of stool in the colon particularly the rectum, fecal impaction. Chest x-ray right hilar mass cannot be excluded but lungs are clear. His EKG shows that he has atrial fibrillation at rate of 108, nonspecific T wave changes.   IMPRESSION:  1. Coffee-ground vomiting, increased BUN, guaiac-positive stools, suspect upper GI bleed with duodenitis on the CT scan. May be NSAID related. 2. Anemia, acute, most likely secondary to GI bleed.  3. Leukocytosis secondary to dehydration versus GI bleed. 4. Acute renal failure.  5. Mild hyponatremia.  6. Hypotension which may be a combination of dehydration and GI bleed.  7. Bilateral wheezing with no prior diagnosis of chronic obstructive pulmonary disease with long-term smoking history, suspect chronic obstructive pulmonary disease exacerbation. 8. Fecal impaction.  9. Hypothyroidism.  10. Chronic hip pain. 11. Chronic atrial fibrillation.  PLAN: This is a 81 year old male with chronic atrial fibrillation, hypertension, hypothyroidism, chronic hip pain. He was brought in by EMS. The patient said that he has not been eating for 11 days. He did not have a bowel  movement for 10 to 11 days. He also has been vomiting coffee-ground emesis here. His BUN is elevated. He is hypotensive. He is anemic. Suspect GI bleed. He is also on NSAIDs at home and he drinks alcohol every day and he smokes. He got a dose of Protonix in the Emergency Room. I'm going to put him on a Protonix drip. Hold his NSAIDs . 2 units of packed RBC transfusion has been ordered by the Emergency Room physician. We're going to repeat his hemoglobin later on. Keep him n.p.o. for now. GI will be consulted and he may need an upper GI endoscopy. Will try to fluid resuscitate him. He got 2 liters of normal saline bolus in the Emergency Room. I will give him another 500 mL of normal saline bolus and will continue IV hydration on him. Will do serial hemoglobins on him also. He already had an NG tube  inserted in the Emergency Room. His leukocytosis could be secondary to dehydration versus GI bleed versus occult infection. Blood cultures have been sent and he has been started on Cipro and Flagyl in the Emergency Room. Will continue that. Will also check a urinalysis on him. If his infection workup is negative, this can be discontinued. His INR and platelet count is normal. His increased MCV may be because of his alcohol related macrocytosis. I'm going to put him on CIWA protocol also. I'm going to add iron studies, K86, and folic acid on his blood work also. Will hold his antihypertensives at this time because of his hypertension. He is also bilaterally wheezing. I'm going to give him some Solu-Medrol, give him DuoNebs with Xopenex and Atrovent. Will also call a PT consult on him. He may need short-term rehab at the time of discharge depending upon physical therapy recommendations.  I discussed CODE STATUS with him. He is FULL CODE. I had a detailed discussion with the family members.  TIME SPENT WITH ADMISSION AND COORDINATION OF CARE: 60 minutes.   ____________________________ Mena Pauls, MD ag:drc D: 04/06/2011 17:07:55 ET T: 04/07/2011 08:07:13 ET JOB#: [id number]  cc: Mena Pauls, MD, <Dictator> [employer] Caryn Section, MD Mena Pauls MD ELECTRONICALLY SIGNED 05/05/2011 14:52

## 2014-07-11 ENCOUNTER — Telehealth: Payer: Self-pay | Admitting: Family Medicine

## 2014-07-11 MED ORDER — FENTANYL 75 MCG/HR TD PT72: 75.0000 ug | MEDICATED_PATCH | TRANSDERMAL | Status: DC

## 2014-07-11 MED ORDER — HYDROCODONE-ACETAMINOPHEN 5-325 MG PO TABS: 1.0000 | ORAL_TABLET | Freq: Two times a day (BID) | ORAL | Status: DC

## 2014-07-11 NOTE — Telephone Encounter (Signed)
Pt called to request a Rx for Fentanyl Patch and Hydrocodone.  Pt states these will to be mailed [email or web address] .  CB#[phone]/MJ

## 2014-07-14 ENCOUNTER — Telehealth: Payer: Self-pay | Admitting: Family Medicine

## 2014-07-14 NOTE — Telephone Encounter (Signed)
Pt wanted to make sure the fentaNYL (DURAGESIC - DOSED MCG/HR) 75 MCG/HR & HYDROcodone-acetaminophen (NORCO/VICODIN) 5-325 MG per tablet were mailed to the pharmacy because the pharmacy told pt they haven't received them. Thanks TNP

## 2014-07-15 NOTE — Telephone Encounter (Signed)
Pharmacist did receive rx's.

## 2014-07-26 ENCOUNTER — Other Ambulatory Visit: Payer: Self-pay | Admitting: *Deleted

## 2014-07-26 MED ORDER — TRAZODONE HCL 100 MG PO TABS: 100.0000 mg | ORAL_TABLET | Freq: Three times a day (TID) | ORAL | Status: DC

## 2014-07-26 NOTE — Telephone Encounter (Signed)
Refill request for Trazodone 100 mg Last filled by MD on- 12/13/2013 #90 x6 Last Appt: 06/10/2014 Next Appt: none Please advise refill?

## 2014-07-28 ENCOUNTER — Telehealth: Payer: Self-pay | Admitting: Family Medicine

## 2014-07-28 NOTE — Telephone Encounter (Signed)
Pt contacted office for refill request on the following medications:  Alprazolam 1mg .  [facility]. Pt is asking if we can call this the pharmacy before 12:00 today.  CB#[phone]/MJ

## 2014-07-29 ENCOUNTER — Other Ambulatory Visit: Payer: Self-pay | Admitting: Family Medicine

## 2014-07-29 MED ORDER — ALPRAZOLAM 1 MG PO TABS: ORAL_TABLET | ORAL | Status: DC

## 2014-08-03 ENCOUNTER — Telehealth: Payer: Self-pay | Admitting: Emergency Medicine

## 2014-08-03 NOTE — Telephone Encounter (Signed)
Pt pharmacy sent over request for refill for pt medication.

## 2014-08-07 ENCOUNTER — Emergency Department: Payer: Medicare Other

## 2014-08-07 ENCOUNTER — Emergency Department
Admission: EM | Admit: 2014-08-07 | Discharge: 2014-08-07 | Disposition: A | Discharge status: Home / Self Care | Payer: Medicare Other | Attending: Emergency Medicine | Admitting: Emergency Medicine

## 2014-08-07 DIAGNOSIS — R58 Hemorrhage, not elsewhere classified: Secondary | ICD-10-CM

## 2014-08-07 DIAGNOSIS — X58XXXA Exposure to other specified factors, initial encounter: Secondary | ICD-10-CM | POA: Insufficient documentation

## 2014-08-07 DIAGNOSIS — Y998 Other external cause status: Secondary | ICD-10-CM | POA: Diagnosis not present

## 2014-08-07 DIAGNOSIS — S20212A Contusion of left front wall of thorax, initial encounter: Secondary | ICD-10-CM | POA: Diagnosis not present

## 2014-08-07 DIAGNOSIS — Y9289 Other specified places as the place of occurrence of the external cause: Secondary | ICD-10-CM | POA: Diagnosis not present

## 2014-08-07 DIAGNOSIS — Y9389 Activity, other specified: Secondary | ICD-10-CM | POA: Insufficient documentation

## 2014-08-07 DIAGNOSIS — S29001A Unspecified injury of muscle and tendon of front wall of thorax, initial encounter: Secondary | ICD-10-CM | POA: Diagnosis present

## 2014-08-07 DIAGNOSIS — Z79899 Other long term (current) drug therapy: Secondary | ICD-10-CM | POA: Diagnosis not present

## 2014-08-07 LAB — CBC WITH DIFFERENTIAL/PLATELET
BASOS PCT: 1 %
Basophils Absolute: 0 10*3/uL (ref 0–0.1)
EOS ABS: 0 10*3/uL (ref 0–0.7)
Eosinophils Relative: 0 %
HCT: 34.2 % — ABNORMAL LOW (ref 40.0–52.0)
Hemoglobin: 11.6 g/dL — ABNORMAL LOW (ref 13.0–18.0)
Lymphocytes Relative: 14 %
Lymphs Abs: 0.8 10*3/uL — ABNORMAL LOW (ref 1.0–3.6)
MCH: 32.9 pg (ref 26.0–34.0)
MCHC: 33.8 g/dL (ref 32.0–36.0)
MCV: 97.5 fL (ref 80.0–100.0)
Monocytes Absolute: 0.5 10*3/uL (ref 0.2–1.0)
Monocytes Relative: 8 %
NEUTROS ABS: 4.5 10*3/uL (ref 1.4–6.5)
NEUTROS PCT: 77 %
Platelets: 168 10*3/uL (ref 150–440)
RBC: 3.51 MIL/uL — ABNORMAL LOW (ref 4.40–5.90)
RDW: 13.3 % (ref 11.5–14.5)
WBC: 5.8 10*3/uL (ref 3.8–10.6)

## 2014-08-07 LAB — BASIC METABOLIC PANEL
Anion gap: 11 (ref 5–15)
BUN: 21 mg/dL — ABNORMAL HIGH (ref 6–20)
CO2: 26 mmol/L (ref 22–32)
Calcium: 8.2 mg/dL — ABNORMAL LOW (ref 8.9–10.3)
Chloride: 91 mmol/L — ABNORMAL LOW (ref 101–111)
Creatinine, Ser: 0.7 mg/dL (ref 0.61–1.24)
GFR calc Af Amer: >60 mL/min (ref >60)
GFR calc non Af Amer: >60 mL/min (ref >60)
Glucose, Bld: 82 mg/dL (ref 65–99)
Potassium: 4.1 mmol/L (ref 3.5–5.1)
SODIUM: 128 mmol/L — AB (ref 135–145)
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
## 2014-08-07 NOTE — ED Provider Notes (Signed)
[facility] Emergency Department Provider Note ____________________________________________  Time seen: 6:55 pm  I have reviewed the triage vital signs and the nursing notes.  HISTORY  Chief Complaint  Bleeding/Bruising  HPI David Mcmillan is a 81 y.o. male reports to the ED for evaluation management of some bruising to his chest that he noted late on Wednesday. He doses Xarelto for a-fib, and is severely kyphotic from a remote thoracic spine compression fracture, for that reason he had a difficult time when he was at the eye clinic at [facility] on Wednesday for his routine eye exam. He describes having to hold himself up in the slit lamp and other diagnostic equipment, with his chin and head against the rests. He believes that in doing so he may have caused some local bruising to his anterior and right lateral chest wall. He recallshe first noted some pain Wednesday afternoon when he reached up with his right arm, when he went to look at his chest, he noted bruising about the size of a football. By Thursday, it had spread to the anterior chest. He claims it has not spread beyond that point since then.  He denies, SOB, syncope, chest pain, epistaxis, hemoptysis, hematochezia, melena, hematuria, or other symptoms.   No past medical history on file.  There are no active problems to display for this patient.  No past surgical history on file.  Current Outpatient Rx  Name  Route  Sig  Dispense  Refill  . ALPRAZolam (XANAX) 1 MG tablet      Take one tablet two to three times a day, and one at bedtime. Do not take more than four within 24 hours.   28 tablet   5     Please deliver   . fentaNYL (DURAGESIC - DOSED MCG/HR) 75 MCG/HR   Transdermal   Place 1 patch (75 mcg total) onto the skin every 3 (three) days.   10 patch   0   . HYDROcodone-acetaminophen (NORCO/VICODIN) 5-325 MG per tablet   Oral   Take 1 tablet by mouth 2 (two) times daily.   60 tablet    0   . traZODone (DESYREL) 100 MG tablet   Oral   Take 1 tablet (100 mg total) by mouth 3 (three) times daily.   90 tablet   11    Allergies Review of patient's allergies indicates no known allergies.  No family history on file.  Social History History  Substance Use Topics  . Smoking status: Not on file  . Smokeless tobacco: Not on file  . Alcohol Use: Not on file   Review of Systems  Constitutional: Negative for fever. Eyes: Negative for visual changes. ENT: Negative for sore throat. Cardiovascular: Negative for chest pain. Respiratory: Negative for shortness of breath. Gastrointestinal: Negative for abdominal pain, vomiting and diarrhea. Genitourinary: Negative for dysuria. Musculoskeletal: Negative for back pain. Report mild right chest wall pain and bruising. Skin: Negative for rash. Neurological: Negative for headaches, focal weakness or numbness. ____________________________________________  PHYSICAL EXAM:  VITAL SIGNS: ED Triage Vitals  Enc Vitals Group     BP 08/07/14 1746 115/54 mmHg     Pulse Rate 08/07/14 1746 73     Resp 08/07/14 1746 18     Temp 08/07/14 1746 98.5 F (36.9 C)     Temp Source 08/07/14 1746 Oral     SpO2 --      Weight 08/07/14 1746 145 lb (65.772 kg)     Height 08/07/14 1746  5' (1.524 m)     Head Cir --      Peak Flow --      Pain Score --      Pain Loc --      Pain Edu? --      Excl. in [place]? --    Constitutional: Alert and oriented, pleasant and engaging. Well appearing and in no distress. Patient sitting in a wheelchair for interview and exam.  Eyes: Conjunctivae are normal. PERRL. Normal extraocular movements. ENT   Head: Normocephalic and atraumatic.   Nose: No congestion/rhinnorhea.   Mouth/Throat: Mucous membranes are moist.   Neck: Supple. No thyromegaly. Hematological/Lymphatic/Immunilogical: No cervical lymphadenopathy. Cardiovascular: Normal rate, irregular rhythm without lutter.  Respiratory: Normal  respiratory effort. No wheezes/rales/rhonchi. Severe thoracic kyphosis noted.  Gastrointestinal: Soft and nontender. No distention. Musculoskeletal: Nontender with normal range of motion in all extremities.  Neurologic:  Normal speech and language. No gross focal neurologic deficits are appreciated. Skin:  Skin is warm, dry and intact. No rash noted. Chest wall with dark, ecchymotic skin change in a wide linear distribution from the posterior, left axillary line to the left side of the sternum. There is no tenderness, induration, or warmth appreciated.  Psychiatric: Mood and affect are normal. Patient exhibits appropriate insight and judgment. ____________________________________________    LABS (pertinent positives/negatives)  Labs Reviewed  CBC WITH DIFFERENTIAL/PLATELET - Abnormal; Notable for the following:    RBC 3.51 (*)    Hemoglobin 11.6 (*)    HCT 34.2 (*)    Lymphs Abs 0.8 (*)    All other components within normal limits  BASIC METABOLIC PANEL - Abnormal; Notable for the following:    Sodium 128 (*)    Chloride 91 (*)    BUN 21 (*)    Calcium 8.2 (*)    All other components within normal limits  _____________________________   RADIOLOGY CXR  IMPRESSION: No acute cardiopulmonary process seen. Cardiomegaly noted  I, Lister Brizzi, Dannielle Karvonen, personally viewed and evaluated these images as part of my medical decision making.  ____________________________________________  INITIAL IMPRESSION / [place] / ED COURSE  Labs and radiology reviewed. No acute chest wall injury or cardiopulmonary process seen. Normal platelet count. Patient notified of results.  Discussed the action and correction/reversal of coagulation of Xarelto, but clinical presentation represents minor bleeding category, and not change in dosing regimen is indicated.  Patient to follow-up with Dr. Alm Bustard as scheduled or return as needed for indications of active bleeding as discussed.   ____________________________________________  FINAL CLINICAL IMPRESSION(S) / ED DIAGNOSES  Final diagnoses:  Ecchymosis     Melvenia Needles, PA-C 08/07/14 2258  Nena Polio, MD 08/08/14 [id number]

## 2014-08-07 NOTE — Discharge Instructions (Signed)
Your labs and chest x-ray are normal today, despite the large area of bruising (hematoma) on your chest.  Continue to dose your Xarelto as directed.  Follow-up with Dr. Alm Bustard as scheduled. Return to the ED as needed for worsening symptoms, including bleeding from the nose, rectum, penis, or coughing or vomiting blood.

## 2014-08-09 ENCOUNTER — Telehealth: Payer: Self-pay | Admitting: Family Medicine

## 2014-08-09 DIAGNOSIS — M549 Dorsalgia, unspecified: Secondary | ICD-10-CM

## 2014-08-09 NOTE — Telephone Encounter (Signed)
Pt went to ER this weekend.  He has got several unexplained bruises.  They did several text and could not tell him anything.  They told him to call his PCP.  Please call him back at [phone].  tp

## 2014-08-09 NOTE — Telephone Encounter (Signed)
Called patient and he states he was in the hospital this weekend and he wanted Dr. Caryn Section to know. He wanted to be sure we received the test and records. I checked patient chart and ER records are in his chart. The discharge summary did not recommned a hospital follow up right away. Just states follow up with PCP as scheduled or if symptoms  Worsens. Patient also request a refill on Fentanyl patches and Hydrocodone. Patient wants them mailed to [place]. Jake Shark

## 2014-08-10 MED ORDER — HYDROCODONE-ACETAMINOPHEN 5-325 MG PO TABS: 1.0000 | ORAL_TABLET | Freq: Two times a day (BID) | ORAL | Status: DC

## 2014-08-10 MED ORDER — FENTANYL 75 MCG/HR TD PT72: 75.0000 ug | MEDICATED_PATCH | TRANSDERMAL | Status: DC

## 2014-08-10 NOTE — Telephone Encounter (Signed)
Please send my a refill request for fentanyl and hydrocodone.

## 2014-08-10 NOTE — Telephone Encounter (Signed)
Do no want a separate refill request message sent to you? I included them with this message and pended the re oder status. They are in the Meds & Orders section of this message.

## 2014-08-15 ENCOUNTER — Telehealth: Payer: Self-pay | Admitting: Family Medicine

## 2014-08-15 NOTE — Telephone Encounter (Signed)
Patient notified that rx's were mailed to pharmacy.

## 2014-08-15 NOTE — Telephone Encounter (Signed)
Pt wanted to make sure that his RX for the patch and the pain RX were mailed to [place]. Thanks TNP

## 2014-08-18 ENCOUNTER — Other Ambulatory Visit: Payer: Self-pay | Admitting: Family Medicine

## 2014-08-18 DIAGNOSIS — K5900 Constipation, unspecified: Secondary | ICD-10-CM | POA: Insufficient documentation

## 2014-08-22 ENCOUNTER — Other Ambulatory Visit: Payer: Self-pay | Admitting: Family Medicine

## 2014-08-29 ENCOUNTER — Other Ambulatory Visit: Payer: Self-pay | Admitting: Family Medicine

## 2014-09-01 ENCOUNTER — Other Ambulatory Visit: Payer: Self-pay | Admitting: Family Medicine

## 2014-09-01 NOTE — Telephone Encounter (Signed)
Pharmacy stated that patient has no more refills.

## 2014-09-02 NOTE — Telephone Encounter (Signed)
Rx called in to pharmacy. 

## 2014-09-05 ENCOUNTER — Other Ambulatory Visit: Payer: Self-pay | Admitting: Family Medicine

## 2014-09-05 DIAGNOSIS — M549 Dorsalgia, unspecified: Secondary | ICD-10-CM

## 2014-09-05 NOTE — Telephone Encounter (Signed)
Pt contacted office for refill request on the following medications:  fentaNYL (DURAGESIC - DOSED MCG/HR) 75 MCG/HR  And  HYDROcodone-acetaminophen (NORCO/VICODIN) 5-325 MG.  Please fax to [email or web address] .  CB#[phone]/MW

## 2014-09-06 MED ORDER — FENTANYL 75 MCG/HR TD PT72: 75.0000 ug | MEDICATED_PATCH | TRANSDERMAL | Status: DC

## 2014-09-12 ENCOUNTER — Other Ambulatory Visit: Payer: Self-pay | Admitting: Family Medicine

## 2014-09-12 NOTE — Telephone Encounter (Signed)
noted 

## 2014-09-12 NOTE — Telephone Encounter (Signed)
Pt stated that [facility] received the RX for the patches but not for  HYDROcodone-acetaminophen (NORCO/VICODIN) 5-325 MG per tablet and pt would like [place] to look into this and resend the RX. Thanks TNP

## 2014-09-12 NOTE — Telephone Encounter (Signed)
Pt called back and because he is coming in for OV tomorrow he will just get the RX then if that's ok. Thanks TNP

## 2014-09-13 ENCOUNTER — Encounter: Payer: Self-pay | Admitting: Family Medicine

## 2014-09-13 ENCOUNTER — Ambulatory Visit (INDEPENDENT_AMBULATORY_CARE_PROVIDER_SITE_OTHER): Payer: Medicare Other | Admitting: Family Medicine

## 2014-09-13 ENCOUNTER — Other Ambulatory Visit: Payer: Self-pay

## 2014-09-13 VITALS — BP 122/64 | HR 82 | Temp 98.3°F

## 2014-09-13 DIAGNOSIS — M25569 Pain in unspecified knee: Secondary | ICD-10-CM

## 2014-09-13 DIAGNOSIS — L97409 Non-pressure chronic ulcer of unspecified heel and midfoot with unspecified severity: Secondary | ICD-10-CM | POA: Insufficient documentation

## 2014-09-13 DIAGNOSIS — R110 Nausea: Secondary | ICD-10-CM | POA: Insufficient documentation

## 2014-09-13 DIAGNOSIS — M79606 Pain in leg, unspecified: Secondary | ICD-10-CM | POA: Diagnosis not present

## 2014-09-13 DIAGNOSIS — R5383 Other fatigue: Secondary | ICD-10-CM | POA: Insufficient documentation

## 2014-09-13 DIAGNOSIS — Z8781 Personal history of (healed) traumatic fracture: Secondary | ICD-10-CM | POA: Diagnosis not present

## 2014-09-13 DIAGNOSIS — I4891 Unspecified atrial fibrillation: Secondary | ICD-10-CM

## 2014-09-13 DIAGNOSIS — R195 Other fecal abnormalities: Secondary | ICD-10-CM | POA: Insufficient documentation

## 2014-09-13 DIAGNOSIS — L03116 Cellulitis of left lower limb: Secondary | ICD-10-CM

## 2014-09-13 DIAGNOSIS — M1990 Unspecified osteoarthritis, unspecified site: Secondary | ICD-10-CM | POA: Insufficient documentation

## 2014-09-13 DIAGNOSIS — F430 Acute stress reaction: Secondary | ICD-10-CM | POA: Insufficient documentation

## 2014-09-13 DIAGNOSIS — H409 Unspecified glaucoma: Secondary | ICD-10-CM

## 2014-09-13 DIAGNOSIS — B354 Tinea corporis: Secondary | ICD-10-CM

## 2014-09-13 DIAGNOSIS — F32A Depression, unspecified: Secondary | ICD-10-CM | POA: Insufficient documentation

## 2014-09-13 DIAGNOSIS — F329 Major depressive disorder, single episode, unspecified: Secondary | ICD-10-CM | POA: Insufficient documentation

## 2014-09-13 DIAGNOSIS — D649 Anemia, unspecified: Secondary | ICD-10-CM | POA: Insufficient documentation

## 2014-09-13 DIAGNOSIS — R600 Localized edema: Secondary | ICD-10-CM | POA: Insufficient documentation

## 2014-09-13 DIAGNOSIS — Z8669 Personal history of other diseases of the nervous system and sense organs: Secondary | ICD-10-CM | POA: Insufficient documentation

## 2014-09-13 DIAGNOSIS — L03115 Cellulitis of right lower limb: Secondary | ICD-10-CM | POA: Insufficient documentation

## 2014-09-13 DIAGNOSIS — I959 Hypotension, unspecified: Secondary | ICD-10-CM | POA: Insufficient documentation

## 2014-09-13 DIAGNOSIS — G2581 Restless legs syndrome: Secondary | ICD-10-CM | POA: Insufficient documentation

## 2014-09-13 DIAGNOSIS — M549 Dorsalgia, unspecified: Secondary | ICD-10-CM | POA: Insufficient documentation

## 2014-09-13 DIAGNOSIS — K921 Melena: Secondary | ICD-10-CM | POA: Insufficient documentation

## 2014-09-13 DIAGNOSIS — R42 Dizziness and giddiness: Secondary | ICD-10-CM | POA: Insufficient documentation

## 2014-09-13 DIAGNOSIS — M11262 Other chondrocalcinosis, left knee: Secondary | ICD-10-CM | POA: Insufficient documentation

## 2014-09-13 DIAGNOSIS — N400 Enlarged prostate without lower urinary tract symptoms: Secondary | ICD-10-CM | POA: Insufficient documentation

## 2014-09-13 DIAGNOSIS — E871 Hypo-osmolality and hyponatremia: Secondary | ICD-10-CM | POA: Insufficient documentation

## 2014-09-13 DIAGNOSIS — L989 Disorder of the skin and subcutaneous tissue, unspecified: Secondary | ICD-10-CM | POA: Insufficient documentation

## 2014-09-13 MED ORDER — HYDROCODONE-ACETAMINOPHEN 5-325 MG PO TABS: 1.0000 | ORAL_TABLET | Freq: Two times a day (BID) | ORAL | Status: DC

## 2014-09-13 MED ORDER — KETOCONAZOLE 2 % EX CREA: 1.0000 "application " | TOPICAL_CREAM | Freq: Every day | CUTANEOUS | Status: AC

## 2014-09-13 NOTE — Progress Notes (Signed)
Patient: David Mcmillan Male    DOB: 1932-12-18   81 y.o.   MRN: [id number] Visit Date: 09/13/2014  Today's Provider: Lelon Huh, MD   Chief Complaint  Patient presents with  . Bleeding/Bruising    Abnormal Bruising   Subjective:    HPI Abnormal Bruising:   Patient comes in today stating he was seen at [facility]  ER 1 month ago for abnormal bruising of the chest. He had normal labs and XR. He is on Xarelto fo a-fib.Patient states the bruising has improved significantly but is not completely resolved. Patient denies any chest pain or shortness of breath. Patient denies any abnormal bleeding. Patient states he has not had any bruising anywhere else on his body other than his chest.    Rash He has had a rash at the crease across his mid abdomen off and on for years. He states that Dr. Koleen Nimrod used to prescribe triamcinolone for this which helped a little bit. It has flared up lately and he would like a cream for it.     Allergies  Allergen Reactions  . Invanz  [Ertapenem]     Seizure   Previous Medications   ACETAMINOPHEN (TYLENOL) 325 MG TABLET    Take 2 tablets by mouth every 6 (six) hours as needed. For pain   ALPRAZOLAM (XANAX) 1 MG TABLET    TAKE 1 TABLET BY MOUTH 2-3 TIMES DAILY AND 1 AT BEDTIME NO MORE THAN 4/24HR   ALUM & MAG HYDROXIDE-SIMETH (MAGIC MOUTHWASH) SOLN    Take 10 mLs by mouth. Swish and spit out three times daily   CLOTRIMAZOLE-BETAMETHASONE (LOTRISONE) CREAM    Apply 1 application topically 2 (two) times daily.   DORZOLAMIDE (TRUSOPT) 2 % OPHTHALMIC SOLUTION       FENTANYL (DURAGESIC - DOSED MCG/HR) 75 MCG/HR    Place 1 patch (75 mcg total) onto the skin every 3 (three) days.   FERROUS SULFATE 325 (65 FE) MG TABLET    Take 1 tablet by mouth daily.   FINASTERIDE (PROSCAR) 5 MG TABLET    Take 1 tablet by mouth daily.   FUROSEMIDE (LASIX) 20 MG TABLET    TAKE ONE (1) TABLET EACH DAY FOR SWELLING   FUROSEMIDE (LASIX) 20 MG TABLET    Take 1 tablet by mouth  daily as needed. For swelling   HYDROCODONE-ACETAMINOPHEN (NORCO/VICODIN) 5-325 MG PER TABLET    Take 1 tablet by mouth 2 (two) times daily.   LATANOPROST (XALATAN) 0.005 % OPHTHALMIC SOLUTION    Apply to eye.   LEVOTHYROXINE (SYNTHROID, LEVOTHROID) 88 MCG TABLET    TAKE ONE (1) TABLET EACH DAY   MELATONIN 3 MG TABS    Take 1 tablet by mouth at bedtime.   MELOXICAM (MOBIC) 15 MG TABLET    Take 1 tablet by mouth daily.   METOPROLOL (LOPRESSOR) 50 MG TABLET    TAKE 1/2 TABLET BY MOUTH TWICE DAILY   MUPIROCIN OINTMENT (BACTROBAN) 2 %    Apply 1 application topically 2 (two) times daily as needed.   PANTOPRAZOLE (PROTONIX) 40 MG TABLET    Take 1 tablet by mouth daily.   RAMELTEON (ROZEREM) 8 MG TABLET    Take 1 tablet by mouth at bedtime.   RIVAROXABAN (XARELTO) 20 MG TABS TABLET    Take 1 tablet by mouth daily. With evening meal   ROPINIROLE (REQUIP) 1 MG TABLET    Take 1 tablet by mouth at bedtime.   SILVER SULFADIAZINE (SILVADENE)  1 % CREAM    Apply 1 application topically 2 (two) times daily.   STOOL SOFTENER 100 MG CAPSULE    TAKE TWO CAPSULES BY MOUTH TWICE DAILY.   TAMSULOSIN (FLOMAX) 0.4 MG CAPS CAPSULE    Take 1 capsule by mouth daily.   TIZANIDINE (ZANAFLEX) 4 MG TABLET    Take 1 tablet by mouth every 6 (six) hours as needed.   TRAZODONE (DESYREL) 100 MG TABLET    Take 1 tablet (100 mg total) by mouth 3 (three) times daily.   TRIAMCINOLONE CREAM (KENALOG) 0.1 %    Apply 1 application topically 2 (two) times daily as needed. To affected area   VENLAFAXINE XR (EFFEXOR-XR) 75 MG 24 HR CAPSULE    TAKE THREE CAPSULES BY MOUTH DAILY    Review of Systems  Constitutional: Negative for fever and chills.  Cardiovascular: Positive for chest pain. Negative for palpitations and leg swelling.  Musculoskeletal: Positive for back pain and arthralgias.  Hematological: Bruises/bleeds easily.  All other systems reviewed and are negative.   History  Substance Use Topics  . Smoking status: Current  Every Day Smoker -- 0.02 packs/day    Types: Cigarettes  . Smokeless tobacco: Not on file  . Alcohol Use: No   Objective:   BP 122/64 mmHg  Pulse 82  Temp(Src) 98.3 F (36.8 C) (Oral)  SpO2 99%  Physical Exam  General Appearance:    Alert, cooperative, no distress  Eyes:    PERRL, conjunctiva/corneas clear, EOM's intact       Lungs:     Clear to auscultation bilaterally, respirations unlabored  Heart:    Irregularly irregular rhythm  Neurologic:   Awake, alert, oriented x 3. No apparent focal neurological           defect.   Derm:   Mild flaky erythematous rash across upper abdomen consistent with tinea corporis.    Results for orders placed or performed during the hospital encounter of 08/07/14  CBC with Differential  Result Value Ref Range   WBC 5.8 3.8 - 10.6 K/uL   RBC 3.51 (L) 4.40 - 5.90 MIL/uL   Hemoglobin 11.6 (L) 13.0 - 18.0 g/dL   HCT 34.2 (L) 40.0 - 52.0 %   MCV 97.5 80.0 - 100.0 fL   MCH 32.9 26.0 - 34.0 pg   MCHC 33.8 32.0 - 36.0 g/dL   RDW 13.3 11.5 - 14.5 %   Platelets 168 150 - 440 K/uL   Neutrophils Relative % 77 %   Neutro Abs 4.5 1.4 - 6.5 K/uL   Lymphocytes Relative 14 %   Lymphs Abs 0.8 (L) 1.0 - 3.6 K/uL   Monocytes Relative 8 %   Monocytes Absolute 0.5 0.2 - 1.0 K/uL   Eosinophils Relative 0 %   Eosinophils Absolute 0.0 0 - 0.7 K/uL   Basophils Relative 1 %   Basophils Absolute 0.0 0 - 0.1 K/uL  Basic metabolic panel  Result Value Ref Range   Sodium 128 (L) 135 - 145 mmol/L   Potassium 4.1 3.5 - 5.1 mmol/L   Chloride 91 (L) 101 - 111 mmol/L   CO2 26 22 - 32 mmol/L   Glucose, Bld 82 65 - 99 mg/dL   BUN 21 (H) 6 - 20 mg/dL   Creatinine, Ser 0.70 0.61 - 1.24 mg/dL   Calcium 8.2 (L) 8.9 - 10.3 mg/dL   GFR calc non Af Amer >60 >60 mL/min   GFR calc Af Amer >60 >60 mL/min   Anion  gap 11 5 - 15       Assessment & Plan:     1. Tinea corporis  - ketoconazole (NIZORAL) 2 % cream; Apply 1 application topically daily.  Dispense: 45 g; Refill:  1  2. Pain of lower extremity, unspecified laterality Been well controlled on current regimen of fentanyl and hydrocodone - HYDROcodone-acetaminophen (NORCO/VICODIN) 5-325 MG per tablet; Take 1 tablet by mouth 2 (two) times daily.  Dispense: 60 tablet; Refill: 0  3. Knee pain, unspecified laterality  - HYDROcodone-acetaminophen (NORCO/VICODIN) 5-325 MG per tablet; Take 1 tablet by mouth 2 (two) times daily.  Dispense: 60 tablet; Refill: 0  4. H/O fracture of hip  - HYDROcodone-acetaminophen (NORCO/VICODIN) 5-325 MG per tablet; Take 1 tablet by mouth 2 (two) times daily.  Dispense: 60 tablet; Refill: 0  5. Atrial fibrillation, unspecified Rate controlled. Continue Xarelto.   6. Contusion chest wall Slowly resolving.       Lelon Huh, MD  [facility]

## 2014-10-03 ENCOUNTER — Other Ambulatory Visit: Payer: Self-pay | Admitting: Family Medicine

## 2014-10-03 DIAGNOSIS — M25569 Pain in unspecified knee: Secondary | ICD-10-CM

## 2014-10-03 DIAGNOSIS — M79606 Pain in leg, unspecified: Secondary | ICD-10-CM

## 2014-10-03 DIAGNOSIS — M549 Dorsalgia, unspecified: Secondary | ICD-10-CM

## 2014-10-03 DIAGNOSIS — Z8781 Personal history of (healed) traumatic fracture: Secondary | ICD-10-CM

## 2014-10-03 MED ORDER — FENTANYL 75 MCG/HR TD PT72: 75.0000 ug | MEDICATED_PATCH | TRANSDERMAL | Status: DC

## 2014-10-03 MED ORDER — HYDROCODONE-ACETAMINOPHEN 5-325 MG PO TABS: 1.0000 | ORAL_TABLET | Freq: Two times a day (BID) | ORAL | Status: DC

## 2014-10-03 NOTE — Telephone Encounter (Signed)
Rx's mailed to [facility].

## 2014-10-03 NOTE — Telephone Encounter (Signed)
Pt contacted office for refill request on the following medications: HYDROcodone-acetaminophen (NORCO/VICODIN) 5-325 MG per tablet & fentaNYL (DURAGESIC - DOSED MCG/HR) 75 MCG/HR.  Pt request the medications to be mailed to [place]. Thanks TNP

## 2014-10-03 NOTE — Telephone Encounter (Signed)
Printed. Please mail to [facility]. Thanks.

## 2014-10-04 ENCOUNTER — Other Ambulatory Visit: Payer: Self-pay | Admitting: *Deleted

## 2014-10-04 MED ORDER — MELATONIN 3 MG PO TABS: 1.0000 | ORAL_TABLET | Freq: Every day | ORAL | Status: DC

## 2014-10-07 ENCOUNTER — Telehealth: Payer: Self-pay | Admitting: Family Medicine

## 2014-10-11 NOTE — Telephone Encounter (Signed)
Please call in alprazolam.  

## 2014-10-11 NOTE — Telephone Encounter (Signed)
Rx for alprazolam 1 mg, called into pharmacy. Please advise rx for Alcortin a, for rash on pt's back?

## 2014-10-11 NOTE — Telephone Encounter (Signed)
Pt just wanted to make sure this was going to be called in today because the pharmacist won't be there tomorrow and pt would also like  Alcortina sent in for the rash on his back. Thanks TNP

## 2014-10-12 NOTE — Telephone Encounter (Signed)
i've never heard of Alcorina, and would not know what to prescribe for rash without seeing it first.

## 2014-10-13 NOTE — Telephone Encounter (Signed)
Called patient and avised him that the Alprazolam was called into pharmacy. I also advised him as below regarding the prescription for rash on his back. Patient states Dr. Caryn Section saw the rash the last time he was seen in the office.  Patient states don't worry about it because the pharmacist has never heard of this medication either. He states he is fine without it.

## 2014-11-01 ENCOUNTER — Other Ambulatory Visit: Payer: Self-pay | Admitting: Family Medicine

## 2014-11-01 DIAGNOSIS — M79606 Pain in leg, unspecified: Secondary | ICD-10-CM

## 2014-11-01 DIAGNOSIS — M25569 Pain in unspecified knee: Secondary | ICD-10-CM

## 2014-11-01 DIAGNOSIS — Z8781 Personal history of (healed) traumatic fracture: Secondary | ICD-10-CM

## 2014-11-01 DIAGNOSIS — M549 Dorsalgia, unspecified: Secondary | ICD-10-CM

## 2014-11-01 MED ORDER — HYDROCODONE-ACETAMINOPHEN 5-325 MG PO TABS: 1.0000 | ORAL_TABLET | Freq: Two times a day (BID) | ORAL | Status: DC

## 2014-11-01 MED ORDER — FENTANYL 75 MCG/HR TD PT72: 75.0000 ug | MEDICATED_PATCH | TRANSDERMAL | Status: DC

## 2014-11-01 NOTE — Telephone Encounter (Signed)
Needs refill HYDROcodone-acetaminophen (NORCO/VICODIN) 5-325 MG per tablet 10/03/14 -- Birdie Sons, MD Take 1 tablet by mouth 2 (two) times daily. fentaNYL (DURAGESIC - DOSED MCG/HR) 75 MCG/HR 10/03/14 -- Birdie Sons, MD Place 1 patch (75 mcg total) onto the skin every 3 (three) days  He says we mail these to [facility] # [phone]  Thanks Con Memos

## 2014-11-07 ENCOUNTER — Telehealth: Payer: Self-pay | Admitting: Family Medicine

## 2014-11-07 NOTE — Telephone Encounter (Signed)
Pt wanted to make sure that his RX for Hydrocodone & Fentanyl were mailed to [facility]. Please advise and I will let pt know. Thanks TNP

## 2014-11-07 NOTE — Telephone Encounter (Signed)
Prescriptions were placed in an envelope addressed to [facility] and put into the out box here at [facility] on Wednesday 11/02/14. The courier picked up all outgoing mail on Thursday or Friday and mail was sorted for Delivery. Patient advised that prescriptions should be in route to the pharmacy. Called and spoke with Pharmacist Clair Gulling and he states he has not received the prescriptions yet. He states it usually takes a few days for him to get them.

## 2014-11-14 ENCOUNTER — Other Ambulatory Visit: Payer: Self-pay | Admitting: Family Medicine

## 2014-11-14 NOTE — Telephone Encounter (Signed)
Please call in alprazolam.  

## 2014-11-18 ENCOUNTER — Other Ambulatory Visit: Payer: Self-pay | Admitting: Family Medicine

## 2014-11-18 NOTE — Telephone Encounter (Signed)
Prescription was requested and approved on 11/14/2014.

## 2014-11-21 ENCOUNTER — Other Ambulatory Visit: Payer: Self-pay | Admitting: Family Medicine

## 2014-12-02 ENCOUNTER — Telehealth: Payer: Self-pay | Admitting: Family Medicine

## 2014-12-02 DIAGNOSIS — M25569 Pain in unspecified knee: Secondary | ICD-10-CM

## 2014-12-02 DIAGNOSIS — M79606 Pain in leg, unspecified: Secondary | ICD-10-CM

## 2014-12-02 DIAGNOSIS — Z8781 Personal history of (healed) traumatic fracture: Secondary | ICD-10-CM

## 2014-12-02 DIAGNOSIS — M549 Dorsalgia, unspecified: Secondary | ICD-10-CM

## 2014-12-02 MED ORDER — HYDROCODONE-ACETAMINOPHEN 5-325 MG PO TABS: 1.0000 | ORAL_TABLET | Freq: Two times a day (BID) | ORAL | Status: DC

## 2014-12-02 MED ORDER — FENTANYL 75 MCG/HR TD PT72: 75.0000 ug | MEDICATED_PATCH | TRANSDERMAL | Status: DC

## 2014-12-02 NOTE — Telephone Encounter (Signed)
Pt needs refill of HYDROcodone-acetaminophen (NORCO/VICODIN) 5-325 MG tablet 11/01/14 --  fentaNYL (DURAGESIC - DOSED MCG/HR) 75 MCG/HR  Thanks teri

## 2014-12-05 ENCOUNTER — Telehealth: Payer: Self-pay | Admitting: Family Medicine

## 2014-12-05 NOTE — Telephone Encounter (Signed)
Pt wanted to know if we mailed his RX that he requested last Friday 12/02/14. Thanks TNP

## 2014-12-06 NOTE — Telephone Encounter (Signed)
Rx was mailed 12/05/2014.

## 2014-12-12 ENCOUNTER — Other Ambulatory Visit: Payer: Self-pay | Admitting: Family Medicine

## 2014-12-12 NOTE — Telephone Encounter (Signed)
Please call in alprazolam.  

## 2014-12-12 NOTE — Telephone Encounter (Signed)
Rx called in to pharmacy. 

## 2014-12-19 ENCOUNTER — Other Ambulatory Visit: Payer: Self-pay | Admitting: Family Medicine

## 2014-12-26 ENCOUNTER — Other Ambulatory Visit: Payer: Self-pay | Admitting: Family Medicine

## 2014-12-26 DIAGNOSIS — M549 Dorsalgia, unspecified: Secondary | ICD-10-CM

## 2014-12-26 DIAGNOSIS — M79606 Pain in leg, unspecified: Secondary | ICD-10-CM

## 2014-12-26 DIAGNOSIS — M25569 Pain in unspecified knee: Secondary | ICD-10-CM

## 2014-12-26 DIAGNOSIS — Z8781 Personal history of (healed) traumatic fracture: Secondary | ICD-10-CM

## 2014-12-26 NOTE — Telephone Encounter (Signed)
Pt contacted office for refill request on the following medications: 1. HYDROcodone-acetaminophen (NORCO/VICODIN) 5-325 MG tablet  2. fentaNYL (DURAGESIC - DOSED MCG/HR) 75 MCG/HR Pt would like this mailed to [facility] at [place]. Pt stated that he was requesting it b/c of the holiday but that Clair Gulling will not fill the medication until it's due. Thanks TNP

## 2014-12-27 MED ORDER — FENTANYL 75 MCG/HR TD PT72: 75.0000 ug | MEDICATED_PATCH | TRANSDERMAL | Status: DC

## 2014-12-27 MED ORDER — HYDROCODONE-ACETAMINOPHEN 5-325 MG PO TABS: 1.0000 | ORAL_TABLET | Freq: Two times a day (BID) | ORAL | Status: DC

## 2015-01-09 ENCOUNTER — Other Ambulatory Visit: Payer: Self-pay | Admitting: Family Medicine

## 2015-01-12 ENCOUNTER — Other Ambulatory Visit: Payer: Self-pay | Admitting: Family Medicine

## 2015-01-12 NOTE — Telephone Encounter (Signed)
Please call in alprazolam.  

## 2015-01-12 NOTE — Telephone Encounter (Signed)
Prescription called in to pharmacy

## 2015-01-16 ENCOUNTER — Other Ambulatory Visit: Payer: Self-pay | Admitting: Family Medicine

## 2015-01-19 ENCOUNTER — Other Ambulatory Visit: Payer: Self-pay | Admitting: Family Medicine

## 2015-01-19 DIAGNOSIS — M79606 Pain in leg, unspecified: Secondary | ICD-10-CM

## 2015-01-19 DIAGNOSIS — M549 Dorsalgia, unspecified: Secondary | ICD-10-CM

## 2015-01-19 DIAGNOSIS — Z8781 Personal history of (healed) traumatic fracture: Secondary | ICD-10-CM

## 2015-01-19 DIAGNOSIS — M25569 Pain in unspecified knee: Secondary | ICD-10-CM

## 2015-01-19 NOTE — Telephone Encounter (Signed)
Pt contacted office for refill request on the following medications: 1. HYDROcodone-acetaminophen (NORCO/VICODIN) 5-325 MG tablet   2. fentaNYL (DURAGESIC - DOSED MCG/HR) 75 MCG/HR Pt would like these mailed to [facility] and thought he would go ahead and ask since we would be closed some for Christmas. Thanks TNP

## 2015-01-20 MED ORDER — FENTANYL 75 MCG/HR TD PT72: 75.0000 ug | MEDICATED_PATCH | TRANSDERMAL | Status: DC

## 2015-01-20 MED ORDER — HYDROCODONE-ACETAMINOPHEN 5-325 MG PO TABS: 1.0000 | ORAL_TABLET | Freq: Two times a day (BID) | ORAL | Status: DC

## 2015-02-06 ENCOUNTER — Other Ambulatory Visit: Payer: Self-pay | Admitting: Family Medicine

## 2015-02-15 ENCOUNTER — Telehealth: Payer: Self-pay | Admitting: Family Medicine

## 2015-02-15 DIAGNOSIS — M79606 Pain in leg, unspecified: Secondary | ICD-10-CM

## 2015-02-15 DIAGNOSIS — M25569 Pain in unspecified knee: Secondary | ICD-10-CM

## 2015-02-15 DIAGNOSIS — M549 Dorsalgia, unspecified: Secondary | ICD-10-CM

## 2015-02-15 DIAGNOSIS — Z8781 Personal history of (healed) traumatic fracture: Secondary | ICD-10-CM

## 2015-02-15 NOTE — Telephone Encounter (Signed)
Pt contacted office for refill request on the following medications: HYDROcodone-acetaminophen (NORCO/VICODIN) 5-325 MG tablet & fentaNYL (DURAGESIC - DOSED MCG/HR) 75 MCG/HR  To be mailed to [facility]. Thanks TNP

## 2015-02-16 MED ORDER — HYDROCODONE-ACETAMINOPHEN 5-325 MG PO TABS
1.0000 | ORAL_TABLET | Freq: Two times a day (BID) | ORAL | Status: DC
Start: 2015-02-16 — End: 2015-03-16

## 2015-02-16 MED ORDER — FENTANYL 75 MCG/HR TD PT72: 75.0000 ug | MEDICATED_PATCH | TRANSDERMAL | Status: DC

## 2015-02-27 ENCOUNTER — Other Ambulatory Visit: Payer: Self-pay | Admitting: Family Medicine

## 2015-02-27 DIAGNOSIS — F411 Generalized anxiety disorder: Secondary | ICD-10-CM

## 2015-02-27 NOTE — Telephone Encounter (Signed)
Please call in alprazolam.  

## 2015-03-01 ENCOUNTER — Telehealth: Payer: Self-pay | Admitting: Family Medicine

## 2015-03-01 NOTE — Telephone Encounter (Signed)
Pt stated that he is struggling with finding out that his son has brain cancer. Pt is going with his son next week to see what the treatment options are. Pt wanted to know if Dr. Caryn Section could call him in something for anxiety. I advised pt that the RX for Xanax was called in Monday. Pt stated that he already takes that and he would like something a little stronger if possible just to help him out for the next 2 weeks. Pt would like it sent to [facility] today if possible so they can deliver it tomorrow. Please advise. Thanks TNP

## 2015-03-01 NOTE — Telephone Encounter (Signed)
He should really not take any other anxiety medications. They are very dangerous when combined with his pain medications.

## 2015-03-02 NOTE — Telephone Encounter (Signed)
Patient advised and verbally voiced understanding.  

## 2015-03-06 ENCOUNTER — Other Ambulatory Visit: Payer: Self-pay | Admitting: Family Medicine

## 2015-03-08 ENCOUNTER — Ambulatory Visit (INDEPENDENT_AMBULATORY_CARE_PROVIDER_SITE_OTHER): Payer: PPO | Admitting: Family Medicine

## 2015-03-08 ENCOUNTER — Encounter: Payer: Self-pay | Admitting: Family Medicine

## 2015-03-08 VITALS — BP 126/66 | HR 80 | Temp 98.3°F | Resp 16

## 2015-03-08 DIAGNOSIS — H6122 Impacted cerumen, left ear: Secondary | ICD-10-CM

## 2015-03-08 NOTE — Progress Notes (Signed)
Patient: David Mcmillan Male    DOB: May 21, 1932   82 y.o.   MRN: [id number] Visit Date: 03/08/2015  Today's Provider: Lelon Huh, MD   Chief Complaint  Patient presents with  . Ear Fullness    x 1 week   Subjective:    Ear Fullness  There is pain in the left ear. This is a new problem. Episode onset: 1 week ago. The problem occurs constantly. The problem has been unchanged. There has been no fever. The patient is experiencing no pain. Associated symptoms include hearing loss. Pertinent negatives include no abdominal pain, coughing, diarrhea, ear discharge, headaches, neck pain, rash, rhinorrhea, sore throat or vomiting. He has tried ear drops for the symptoms. The treatment provided no relief.   Patient reports he occasionally has itching in his left ear.       Allergies  Allergen Reactions  . Invanz  [Ertapenem]     Seizure   Previous Medications   ACETAMINOPHEN (TYLENOL) 325 MG TABLET    Take 2 tablets by mouth every 6 (six) hours as needed. For pain   ALPRAZOLAM (XANAX) 1 MG TABLET    TAKE 1 TABLET BY MOUTH TWO TO THREE TIMES DAILY AND AT BEDTIME. MAX 4IN 24 HOURS   ALUM & MAG HYDROXIDE-SIMETH (MAGIC MOUTHWASH) SOLN    Take 10 mLs by mouth. Swish and spit out three times daily   CLOTRIMAZOLE-BETAMETHASONE (LOTRISONE) CREAM    Apply 1 application topically 2 (two) times daily.   DORZOLAMIDE (TRUSOPT) 2 % OPHTHALMIC SOLUTION       FENTANYL (DURAGESIC - DOSED MCG/HR) 75 MCG/HR    Place 1 patch (75 mcg total) onto the skin every 3 (three) days.   FERROUS SULFATE 325 (65 FE) MG TABLET    Take 1 tablet by mouth daily.   FINASTERIDE (PROSCAR) 5 MG TABLET    TAKE ONE (1) TABLET BY MOUTH EVERY DAY   FUROSEMIDE (LASIX) 20 MG TABLET    TAKE ONE (1) TABLET EACH DAY FOR SWELLING   HYDROCODONE-ACETAMINOPHEN (NORCO/VICODIN) 5-325 MG TABLET    Take 1 tablet by mouth 2 (two) times daily.   KETOCONAZOLE (NIZORAL) 2 % CREAM    Apply 1 application topically daily.   LATANOPROST  (XALATAN) 0.005 % OPHTHALMIC SOLUTION    Apply to eye.   LEVOTHYROXINE (SYNTHROID, LEVOTHROID) 88 MCG TABLET    TAKE ONE (1) TABLET EACH DAY   MELATONIN 3 MG TABS    Take 1 tablet (3 mg total) by mouth at bedtime.   MELOXICAM (MOBIC) 15 MG TABLET    Take 1 tablet by mouth daily.   METOPROLOL (LOPRESSOR) 50 MG TABLET    TAKE 1/2 TABLET BY MOUTH TWICE DAILY   MUPIROCIN OINTMENT (BACTROBAN) 2 %    Apply 1 application topically 2 (two) times daily as needed.   PANTOPRAZOLE (PROTONIX) 40 MG TABLET    Take 1 tablet by mouth daily.   RAMELTEON (ROZEREM) 8 MG TABLET    Take 1 tablet by mouth at bedtime.   ROPINIROLE (REQUIP) 1 MG TABLET    TAKE ONE TABLET DAILY AT BEDTIME   SILVER SULFADIAZINE (SILVADENE) 1 % CREAM    Apply 1 application topically 2 (two) times daily.   STOOL SOFTENER 100 MG CAPSULE    TAKE TWO CAPSULES BY MOUTH TWICE DAILY.   TAMSULOSIN (FLOMAX) 0.4 MG CAPS CAPSULE    TAKE ONE (1) CAPSULE EACH DAY   TIZANIDINE (ZANAFLEX) 4 MG TABLET  Take 1 tablet by mouth every 6 (six) hours as needed.   TRAZODONE (DESYREL) 100 MG TABLET    Take 1 tablet (100 mg total) by mouth 3 (three) times daily.   TRIAMCINOLONE CREAM (KENALOG) 0.1 %    Apply 1 application topically 2 (two) times daily as needed. To affected area   VENLAFAXINE XR (EFFEXOR-XR) 75 MG 24 HR CAPSULE    TAKE THREE CAPSULES BY MOUTH DAILY   XARELTO 20 MG TABS TABLET    TAKE ONE (1) TABLET EACH DAY WITH EVENING MEAL    Review of Systems  Constitutional: Negative for fever, chills and appetite change.  HENT: Positive for hearing loss. Negative for ear discharge, ear pain, rhinorrhea and sore throat.        Fullness in left ear and itchiness  Respiratory: Negative for cough, chest tightness, shortness of breath and wheezing.   Cardiovascular: Negative for chest pain and palpitations.  Gastrointestinal: Negative for nausea, vomiting, abdominal pain and diarrhea.  Musculoskeletal: Negative for neck pain.  Skin: Negative for rash.    Neurological: Negative for headaches.    Social History  Substance Use Topics  . Smoking status: Current Every Day Smoker -- 0.02 packs/day    Types: Cigarettes  . Smokeless tobacco: Not on file     Comment: 2-3 cigarettes daily  . Alcohol Use: No   Objective:   BP 126/66 mmHg  Pulse 80  Temp(Src) 98.3 F (36.8 C) (Oral)  Resp 16  Physical Exam  ENT: Small amount of cerumen in right ear canal. Left ear canal obstructed with cerumen.     Assessment & Plan:     1. Cerumen impaction, left  After soaking with Debrox, ear canal was irrigated with water until clear. Patient tolerated procedure well and reports resolution of symptoms.       Lelon Huh, MD  [facility]

## 2015-03-13 ENCOUNTER — Other Ambulatory Visit: Payer: Self-pay | Admitting: Family Medicine

## 2015-03-16 ENCOUNTER — Other Ambulatory Visit: Payer: Self-pay

## 2015-03-16 DIAGNOSIS — M549 Dorsalgia, unspecified: Secondary | ICD-10-CM

## 2015-03-16 DIAGNOSIS — Z8781 Personal history of (healed) traumatic fracture: Secondary | ICD-10-CM

## 2015-03-16 DIAGNOSIS — M79606 Pain in leg, unspecified: Secondary | ICD-10-CM

## 2015-03-16 DIAGNOSIS — M25569 Pain in unspecified knee: Secondary | ICD-10-CM

## 2015-03-16 MED ORDER — HYDROCODONE-ACETAMINOPHEN 5-325 MG PO TABS: 1.0000 | ORAL_TABLET | Freq: Two times a day (BID) | ORAL | Status: DC

## 2015-03-16 MED ORDER — FENTANYL 75 MCG/HR TD PT72: 75.0000 ug | MEDICATED_PATCH | TRANSDERMAL | Status: DC

## 2015-03-16 NOTE — Telephone Encounter (Signed)
Patient called and states he was here recently and discussed getting his Norco and Fentanyl patches refilled to [facility] and wanted to see if this was done. Please review. The last fill i see was on 02/16/15-aa

## 2015-03-20 ENCOUNTER — Other Ambulatory Visit: Payer: Self-pay | Admitting: *Deleted

## 2015-03-20 ENCOUNTER — Other Ambulatory Visit: Payer: Self-pay | Admitting: Family Medicine

## 2015-03-20 MED ORDER — MELATONIN 3 MG PO TABS: 1.0000 | ORAL_TABLET | Freq: Every day | ORAL | Status: DC

## 2015-03-27 DIAGNOSIS — H401131 Primary open-angle glaucoma, bilateral, mild stage: Secondary | ICD-10-CM | POA: Diagnosis not present

## 2015-03-27 DIAGNOSIS — H2511 Age-related nuclear cataract, right eye: Secondary | ICD-10-CM | POA: Diagnosis not present

## 2015-04-06 ENCOUNTER — Other Ambulatory Visit: Payer: Self-pay | Admitting: Family Medicine

## 2015-04-07 NOTE — Telephone Encounter (Signed)
Please call in alprazolam.  

## 2015-04-07 NOTE — Telephone Encounter (Signed)
Rx called in to pharmacy. 

## 2015-04-11 ENCOUNTER — Other Ambulatory Visit: Payer: Self-pay | Admitting: Family Medicine

## 2015-04-11 DIAGNOSIS — Z8781 Personal history of (healed) traumatic fracture: Secondary | ICD-10-CM

## 2015-04-11 DIAGNOSIS — M79606 Pain in leg, unspecified: Secondary | ICD-10-CM

## 2015-04-11 DIAGNOSIS — M549 Dorsalgia, unspecified: Secondary | ICD-10-CM

## 2015-04-11 DIAGNOSIS — M25569 Pain in unspecified knee: Secondary | ICD-10-CM

## 2015-04-11 MED ORDER — FENTANYL 75 MCG/HR TD PT72: 75.0000 ug | MEDICATED_PATCH | TRANSDERMAL | Status: DC

## 2015-04-11 MED ORDER — HYDROCODONE-ACETAMINOPHEN 5-325 MG PO TABS: 1.0000 | ORAL_TABLET | Freq: Two times a day (BID) | ORAL | Status: DC

## 2015-04-11 NOTE — Telephone Encounter (Signed)
Pt contacted office for refill request on the following medications: 1. fentaNYL (DURAGESIC - DOSED MCG/HR) 75 MCG/HR   2.HYDROcodone-acetaminophen (NORCO/VICODIN) 5-325 MG tablet  Pt would like it mailed to [facility] at [place]. Thanks TNP

## 2015-04-11 NOTE — Telephone Encounter (Signed)
Rx mailed to pharmacy.

## 2015-04-21 ENCOUNTER — Other Ambulatory Visit: Payer: Self-pay | Admitting: *Deleted

## 2015-04-21 MED ORDER — MAGIC MOUTHWASH: ORAL | Status: DC

## 2015-04-21 NOTE — Telephone Encounter (Signed)
Rx called in to pharmacy. 

## 2015-04-28 ENCOUNTER — Other Ambulatory Visit: Payer: Self-pay | Admitting: Family Medicine

## 2015-05-05 ENCOUNTER — Other Ambulatory Visit: Payer: Self-pay | Admitting: Family Medicine

## 2015-05-11 ENCOUNTER — Other Ambulatory Visit: Payer: Self-pay | Admitting: Family Medicine

## 2015-05-11 NOTE — Telephone Encounter (Signed)
Please call in alprazolam.  

## 2015-05-11 NOTE — Telephone Encounter (Signed)
Rx called in to pharmacy. 

## 2015-05-15 ENCOUNTER — Other Ambulatory Visit: Payer: Self-pay | Admitting: Family Medicine

## 2015-05-15 DIAGNOSIS — Z8781 Personal history of (healed) traumatic fracture: Secondary | ICD-10-CM

## 2015-05-15 DIAGNOSIS — M79606 Pain in leg, unspecified: Secondary | ICD-10-CM

## 2015-05-15 DIAGNOSIS — M549 Dorsalgia, unspecified: Secondary | ICD-10-CM

## 2015-05-15 DIAGNOSIS — M25569 Pain in unspecified knee: Secondary | ICD-10-CM

## 2015-05-15 MED ORDER — FENTANYL 75 MCG/HR TD PT72: 75.0000 ug | MEDICATED_PATCH | TRANSDERMAL | Status: DC

## 2015-05-15 MED ORDER — HYDROCODONE-ACETAMINOPHEN 5-325 MG PO TABS: 1.0000 | ORAL_TABLET | Freq: Two times a day (BID) | ORAL | Status: DC

## 2015-05-15 NOTE — Telephone Encounter (Signed)
Pt contacted office for refill request on the following medications: 1. HYDROcodone-acetaminophen (NORCO/VICODIN) 5-325 MG tablet   2. fentaNYL (DURAGESIC - DOSED MCG/HR) 75 MCG/HR  Pt would like it mailed to [facility]. Last written: 04/11/15. Last OV:03/08/15. Please advise. Thanks TNP

## 2015-05-17 ENCOUNTER — Telehealth: Payer: Self-pay | Admitting: Family Medicine

## 2015-05-17 NOTE — Telephone Encounter (Signed)
Pt is having eye surgery on 05/31/2015 and is asking if he will need to stop taking his XARELTO 20 MG TABS tablet.  CB#[phone]/MW

## 2015-05-17 NOTE — Telephone Encounter (Signed)
I don't know. It depends on what type of surgery he is having. He needs to ask his ophthalmologist. Is OK to stop med for medication for 5 days before surgery if eye doctor thinks he needs to.

## 2015-05-17 NOTE — Telephone Encounter (Signed)
Please advise 

## 2015-05-18 NOTE — Telephone Encounter (Signed)
LMOVM to return call.

## 2015-05-18 NOTE — Telephone Encounter (Signed)
Patient advised as below. Patient states he is having cataract surgery on his right eye. He states the nurse from Ophthalmology recommend he ask Dr. Caryn Section if he should stop the Xarelto. Patient requested that I send this message back to Dr. Caryn Section with the additional information regarding what type of surgery he will be having. Patient was made aware that Dr. Caryn Section was out of the office until Monday 05/22/2015. Patient is fine with waiting until Monday for an answer.

## 2015-05-22 NOTE — Telephone Encounter (Signed)
Pt wanted to call back to see if he should stop any of his medications before his cataract surgery on 05/31/15. Pt stated that the doctor that is doing the procedure advised him to contact our office to see if he needs to stop any of his medications. Please advise. Thanks TNP

## 2015-05-22 NOTE — Telephone Encounter (Signed)
If there is a bleeding risk with the surgery, then he can stop the Xarelto five days before the surgery.    However, as far as I know, risk of bleeding if very low from cataract surgery. He really needs to ask his eye doctor if he thinks it is necessary to stop Xarelto.

## 2015-05-22 NOTE — Telephone Encounter (Signed)
Patient was notified. Patient expressed understanding.  

## 2015-05-31 DIAGNOSIS — F17210 Nicotine dependence, cigarettes, uncomplicated: Secondary | ICD-10-CM | POA: Diagnosis not present

## 2015-05-31 DIAGNOSIS — Z7902 Long term (current) use of antithrombotics/antiplatelets: Secondary | ICD-10-CM | POA: Diagnosis not present

## 2015-05-31 DIAGNOSIS — H401131 Primary open-angle glaucoma, bilateral, mild stage: Secondary | ICD-10-CM | POA: Diagnosis not present

## 2015-05-31 DIAGNOSIS — G4700 Insomnia, unspecified: Secondary | ICD-10-CM | POA: Diagnosis not present

## 2015-05-31 DIAGNOSIS — N400 Enlarged prostate without lower urinary tract symptoms: Secondary | ICD-10-CM | POA: Diagnosis not present

## 2015-05-31 DIAGNOSIS — G2581 Restless legs syndrome: Secondary | ICD-10-CM | POA: Diagnosis not present

## 2015-05-31 DIAGNOSIS — Z85828 Personal history of other malignant neoplasm of skin: Secondary | ICD-10-CM | POA: Diagnosis not present

## 2015-05-31 DIAGNOSIS — F329 Major depressive disorder, single episode, unspecified: Secondary | ICD-10-CM | POA: Diagnosis not present

## 2015-05-31 DIAGNOSIS — Z79899 Other long term (current) drug therapy: Secondary | ICD-10-CM | POA: Diagnosis not present

## 2015-05-31 DIAGNOSIS — G8929 Other chronic pain: Secondary | ICD-10-CM | POA: Diagnosis not present

## 2015-05-31 DIAGNOSIS — H401111 Primary open-angle glaucoma, right eye, mild stage: Secondary | ICD-10-CM | POA: Diagnosis not present

## 2015-05-31 DIAGNOSIS — K219 Gastro-esophageal reflux disease without esophagitis: Secondary | ICD-10-CM | POA: Diagnosis not present

## 2015-05-31 DIAGNOSIS — F419 Anxiety disorder, unspecified: Secondary | ICD-10-CM | POA: Diagnosis not present

## 2015-05-31 DIAGNOSIS — Z993 Dependence on wheelchair: Secondary | ICD-10-CM | POA: Diagnosis not present

## 2015-05-31 DIAGNOSIS — Z9889 Other specified postprocedural states: Secondary | ICD-10-CM | POA: Diagnosis not present

## 2015-05-31 DIAGNOSIS — Z961 Presence of intraocular lens: Secondary | ICD-10-CM | POA: Diagnosis not present

## 2015-05-31 DIAGNOSIS — I4891 Unspecified atrial fibrillation: Secondary | ICD-10-CM | POA: Diagnosis not present

## 2015-05-31 DIAGNOSIS — M1990 Unspecified osteoarthritis, unspecified site: Secondary | ICD-10-CM | POA: Diagnosis not present

## 2015-05-31 DIAGNOSIS — Z888 Allergy status to other drugs, medicaments and biological substances status: Secondary | ICD-10-CM | POA: Diagnosis not present

## 2015-05-31 DIAGNOSIS — E039 Hypothyroidism, unspecified: Secondary | ICD-10-CM | POA: Diagnosis not present

## 2015-05-31 DIAGNOSIS — H2511 Age-related nuclear cataract, right eye: Secondary | ICD-10-CM | POA: Diagnosis not present

## 2015-05-31 DIAGNOSIS — Z85820 Personal history of malignant melanoma of skin: Secondary | ICD-10-CM | POA: Diagnosis not present

## 2015-05-31 DIAGNOSIS — I10 Essential (primary) hypertension: Secondary | ICD-10-CM | POA: Diagnosis not present

## 2015-06-02 ENCOUNTER — Other Ambulatory Visit: Payer: Self-pay | Admitting: Family Medicine

## 2015-06-08 ENCOUNTER — Other Ambulatory Visit: Payer: Self-pay | Admitting: Family Medicine

## 2015-06-08 DIAGNOSIS — M25569 Pain in unspecified knee: Secondary | ICD-10-CM

## 2015-06-08 DIAGNOSIS — M79606 Pain in leg, unspecified: Secondary | ICD-10-CM

## 2015-06-08 DIAGNOSIS — Z8781 Personal history of (healed) traumatic fracture: Secondary | ICD-10-CM

## 2015-06-08 DIAGNOSIS — M549 Dorsalgia, unspecified: Secondary | ICD-10-CM

## 2015-06-08 NOTE — Telephone Encounter (Signed)
Pt contacted office for refill request on the following medications: 1. HYDROcodone-acetaminophen (NORCO/VICODIN) 5-325 MG tablet  2. fentaNYL (DURAGESIC - DOSED MCG/HR) 75 MCG/HR Last Written: 05/15/15 Last OV: 03/08/15 Pt would like this mailed to [facility]. Thanks TNP

## 2015-06-09 MED ORDER — FENTANYL 75 MCG/HR TD PT72: 75.0000 ug | MEDICATED_PATCH | TRANSDERMAL | Status: DC

## 2015-06-09 MED ORDER — HYDROCODONE-ACETAMINOPHEN 5-325 MG PO TABS: 1.0000 | ORAL_TABLET | Freq: Two times a day (BID) | ORAL | Status: DC

## 2015-06-14 ENCOUNTER — Other Ambulatory Visit: Payer: Self-pay | Admitting: Family Medicine

## 2015-06-14 NOTE — Telephone Encounter (Signed)
Please call in alprazolam.  

## 2015-06-14 NOTE — Telephone Encounter (Signed)
Rx called in to pharmacy. 

## 2015-06-16 ENCOUNTER — Other Ambulatory Visit: Payer: Self-pay | Admitting: Family Medicine

## 2015-06-30 ENCOUNTER — Other Ambulatory Visit: Payer: Self-pay | Admitting: Family Medicine

## 2015-07-06 ENCOUNTER — Other Ambulatory Visit: Payer: Self-pay | Admitting: Family Medicine

## 2015-07-06 DIAGNOSIS — Z8781 Personal history of (healed) traumatic fracture: Secondary | ICD-10-CM

## 2015-07-06 DIAGNOSIS — M79606 Pain in leg, unspecified: Secondary | ICD-10-CM

## 2015-07-06 DIAGNOSIS — M25569 Pain in unspecified knee: Secondary | ICD-10-CM

## 2015-07-06 DIAGNOSIS — M549 Dorsalgia, unspecified: Secondary | ICD-10-CM

## 2015-07-06 MED ORDER — FENTANYL 75 MCG/HR TD PT72: 75.0000 ug | MEDICATED_PATCH | TRANSDERMAL | Status: DC

## 2015-07-06 MED ORDER — HYDROCODONE-ACETAMINOPHEN 5-325 MG PO TABS: 1.0000 | ORAL_TABLET | Freq: Two times a day (BID) | ORAL | Status: DC

## 2015-07-06 NOTE — Telephone Encounter (Signed)
Pt contacted office for refill request on the following medications:  Fax to [facility]: Clair Gulling.  CB#[phone]/MW  fentaNYL (DURAGESIC - DOSED MCG/HR) 75 MCG/HR  HYDROcodone-acetaminophen (NORCO/VICODIN) 5-325 MG tablet

## 2015-07-07 ENCOUNTER — Telehealth: Payer: Self-pay

## 2015-07-07 NOTE — Telephone Encounter (Signed)
Stop taking Xarelto for two days. Apply neosporin ointment or Vaseline to both sides of  nasal septum every day and every night and we will take a look at it at o.v. Next week.

## 2015-07-07 NOTE — Telephone Encounter (Signed)
Patient was notified. Expressed understanding.  

## 2015-07-07 NOTE — Telephone Encounter (Signed)
Patient called saying that he has had a nose bleed for the last 2 days. He reports that he woke up this morning with blood on his shirt. He reports that he hasn't had a nose bleed since this morning. He denies dizziness, lightheadedness, or headache. Patient reports that he has a F/U appt with you on Tuesday (07/11/15) and would like to discuss it with you then. He also wanted to know should he continue taking Xarelto due to his nose bleeds? Please advise. Thanks!

## 2015-07-11 ENCOUNTER — Encounter: Payer: Self-pay | Admitting: Family Medicine

## 2015-07-11 ENCOUNTER — Telehealth: Payer: Self-pay | Admitting: Family Medicine

## 2015-07-11 ENCOUNTER — Ambulatory Visit (INDEPENDENT_AMBULATORY_CARE_PROVIDER_SITE_OTHER): Payer: PPO | Admitting: Family Medicine

## 2015-07-11 VITALS — BP 112/58 | HR 58 | Temp 98.1°F | Resp 16 | Ht 60.0 in | Wt 161.0 lb

## 2015-07-11 DIAGNOSIS — R600 Localized edema: Secondary | ICD-10-CM | POA: Diagnosis not present

## 2015-07-11 DIAGNOSIS — L03119 Cellulitis of unspecified part of limb: Secondary | ICD-10-CM | POA: Diagnosis not present

## 2015-07-11 DIAGNOSIS — L989 Disorder of the skin and subcutaneous tissue, unspecified: Secondary | ICD-10-CM

## 2015-07-11 MED ORDER — RIVAROXABAN 15 MG PO TABS: 15.0000 mg | ORAL_TABLET | Freq: Every day | ORAL | Status: DC

## 2015-07-11 MED ORDER — DOXYCYCLINE HYCLATE 100 MG PO TABS: 100.0000 mg | ORAL_TABLET | Freq: Two times a day (BID) | ORAL | Status: DC

## 2015-07-11 NOTE — Progress Notes (Signed)
Patient: David Mcmillan Male    DOB: 1932-10-21   82 y.o.   MRN: [id number] Visit Date: 07/11/2015  Today's Provider: Lelon Huh, MD   Chief Complaint  Patient presents with  . Leg Swelling   Subjective:    HPI  Patient has had sores on the bottom of both legs for 2 weeks. Has had swelling in both legs for 1 week. Skin on legs is red, dry and looks like yellow patches. No drainage. Patient says there is no pain or itching. Also has sores on top of his head.     Allergies  Allergen Reactions  . Invanz  [Ertapenem]     Seizure   Previous Medications   ACETAMINOPHEN (TYLENOL) 325 MG TABLET    Take 2 tablets by mouth every 6 (six) hours as needed. For pain   ALPRAZOLAM (XANAX) 1 MG TABLET    TAKE ONE TABLET 2-3 TIMES DAILY AND AT BEDTIME MAX OF FOUR TABLETS IN24 HOURS   CLOTRIMAZOLE-BETAMETHASONE (LOTRISONE) CREAM    Apply 1 application topically 2 (two) times daily.   DORZOLAMIDE (TRUSOPT) 2 % OPHTHALMIC SOLUTION       FENTANYL (DURAGESIC - DOSED MCG/HR) 75 MCG/HR    Place 1 patch (75 mcg total) onto the skin every 3 (three) days.   FERROUS SULFATE 325 (65 FE) MG TABLET    Take 1 tablet by mouth daily.   FINASTERIDE (PROSCAR) 5 MG TABLET    TAKE ONE (1) TABLET BY MOUTH EVERY DAY   FUROSEMIDE (LASIX) 20 MG TABLET    TAKE ONE (1) TABLET EACH DAY FOR SWELLING   HYDROCODONE-ACETAMINOPHEN (NORCO/VICODIN) 5-325 MG TABLET    Take 1 tablet by mouth 2 (two) times daily.   KETOCONAZOLE (NIZORAL) 2 % CREAM    Apply 1 application topically daily.   LATANOPROST (XALATAN) 0.005 % OPHTHALMIC SOLUTION    Apply to eye.   LEVOTHYROXINE (SYNTHROID, LEVOTHROID) 88 MCG TABLET    TAKE ONE (1) TABLET EACH DAY   MAGIC MOUTHWASH SOLN    Swish and spit out 10 mL three times daily as needed   MELATONIN 3 MG TABS    Take 1 tablet (3 mg total) by mouth at bedtime.   MELOXICAM (MOBIC) 15 MG TABLET    Take 1 tablet by mouth daily.   METOPROLOL (LOPRESSOR) 50 MG TABLET    TAKE 1/2 TABLET BY MOUTH TWICE  DAILY   MUPIROCIN OINTMENT (BACTROBAN) 2 %    Apply 1 application topically 2 (two) times daily as needed.   PANTOPRAZOLE (PROTONIX) 40 MG TABLET    TAKE ONE (1) TABLET EACH DAY   ROPINIROLE (REQUIP) 1 MG TABLET    TAKE ONE TABLET DAILY AT BEDTIME   ROZEREM 8 MG TABLET    TAKE ONE TABLET DAILY AT BEDTIME   SILVER SULFADIAZINE (SILVADENE) 1 % CREAM    Apply 1 application topically 2 (two) times daily.   STOOL SOFTENER 100 MG CAPSULE    TAKE TWO CAPSULES BY MOUTH TWICE DAILY.   TAMSULOSIN (FLOMAX) 0.4 MG CAPS CAPSULE    TAKE ONE (1) CAPSULE EACH DAY   TIZANIDINE (ZANAFLEX) 4 MG TABLET    Take 1 tablet by mouth every 6 (six) hours as needed.   TRAZODONE (DESYREL) 100 MG TABLET    Take 1 tablet (100 mg total) by mouth 3 (three) times daily.   TRIAMCINOLONE CREAM (KENALOG) 0.1 %    APPLY TO AFFECTED AREAS TWICE DAILY AS NEEDED  VENLAFAXINE XR (EFFEXOR-XR) 75 MG 24 HR CAPSULE    TAKE THREE CAPSULES BY MOUTH DAILY AS DIRECTED   XARELTO 20 MG TABS TABLET    TAKE ONE (1) TABLET BY MOUTH EVERY DAY WITH EVENING MEAL    Review of Systems  Constitutional: Negative for fever, chills and appetite change.  Respiratory: Negative for chest tightness, shortness of breath and wheezing.   Cardiovascular: Positive for leg swelling. Negative for chest pain and palpitations.  Gastrointestinal: Negative for nausea, vomiting and abdominal pain.    Social History  Substance Use Topics  . Smoking status: Current Every Day Smoker -- 0.02 packs/day    Types: Cigarettes  . Smokeless tobacco: Not on file     Comment: 2-3 cigarettes daily  . Alcohol Use: No   Objective:   BP 112/58 mmHg  Pulse 58  Temp(Src) 98.1 F (36.7 C) (Oral)  Resp 16  Ht 5' (1.524 m)  Wt 161 lb (73.029 kg)  BMI 31.44 kg/m2  Physical Exam  General appearance: alert, well developed, well nourished, cooperative and in no distress Head: Normocephalic, without obvious abnormality, atraumatic Skin: Small round dried pigmented lesion top  of scalp and extensive hyperkeratotic peeling patches of yellowish skin anterior legs surrounded mildly erythematous area of skin. No discharge. 3+ edema both LEs.      Assessment & Plan:     1. Cellulitis of lower extremity, unspecified laterality  - doxycycline (VIBRA-TABS) 100 MG tablet; Take 1 tablet (100 mg total) by mouth 2 (two) times daily.  Dispense: 28 tablet; Refill: 0  2. Localized edema Keep legs elevated when he is not ambulating.   3. 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  - Ambulatory referral to Dermatology        Lelon Huh, MD  [facility]

## 2015-07-13 ENCOUNTER — Other Ambulatory Visit: Payer: Self-pay | Admitting: Family Medicine

## 2015-07-13 DIAGNOSIS — N400 Enlarged prostate without lower urinary tract symptoms: Secondary | ICD-10-CM

## 2015-07-13 DIAGNOSIS — F411 Generalized anxiety disorder: Secondary | ICD-10-CM

## 2015-07-14 ENCOUNTER — Other Ambulatory Visit: Payer: Self-pay | Admitting: Family Medicine

## 2015-07-14 DIAGNOSIS — N400 Enlarged prostate without lower urinary tract symptoms: Secondary | ICD-10-CM

## 2015-07-16 NOTE — Telephone Encounter (Signed)
Please call in alprazolam.  

## 2015-07-18 ENCOUNTER — Other Ambulatory Visit: Payer: Self-pay | Admitting: Family Medicine

## 2015-07-20 ENCOUNTER — Telehealth: Payer: Self-pay | Admitting: Family Medicine

## 2015-07-20 MED ORDER — SULFAMETHOXAZOLE-TRIMETHOPRIM 800-160 MG PO TABS: 1.0000 | ORAL_TABLET | Freq: Two times a day (BID) | ORAL | Status: AC

## 2015-07-20 NOTE — Telephone Encounter (Signed)
Have sent new prescription for sulfa antibiotic to [facility]

## 2015-07-20 NOTE — Telephone Encounter (Signed)
Patient was notified.

## 2015-07-20 NOTE — Telephone Encounter (Signed)
Pt called in regarding the antibiotic that he was given last week is making him nauseated.  He has tried eating before and after and nothing helps.  He still has 7 more days to go.  He wants to know if there is something else he can take.  He uses [facility].  Patients call back is [phone]  [facility]

## 2015-07-20 NOTE — Telephone Encounter (Signed)
Rx called in to pharmacy. 

## 2015-07-27 ENCOUNTER — Telehealth: Payer: Self-pay | Admitting: Family Medicine

## 2015-07-27 MED ORDER — DOXYCYCLINE HYCLATE 100 MG PO CAPS: 100.0000 mg | ORAL_CAPSULE | Freq: Two times a day (BID) | ORAL | Status: DC

## 2015-07-27 NOTE — Telephone Encounter (Signed)
Patient called back and stated that the doxycycline is the same antibiotic he took and that it made him sick. Patient is requesting a different abx?

## 2015-07-27 NOTE — Telephone Encounter (Signed)
Please advise 

## 2015-07-27 NOTE — Telephone Encounter (Signed)
Change to doxycycline 100mg  twice a day for 14 days. Follow up o.v. 2 weeks.

## 2015-07-27 NOTE — Telephone Encounter (Signed)
Pt calling stating he is out of his medication sulfamethoxazole-trimethoprim (BACTRIM DS,SEPTRA DS) 800-160 MG tablet, pt states nothing has changed as far as his legs, pt thinks that this medication may not be helping but states he did take all 14 pills and is out now.  Thanks CC

## 2015-07-27 NOTE — Telephone Encounter (Signed)
Left message for pt to return call.

## 2015-07-28 MED ORDER — LEVOFLOXACIN 500 MG PO TABS: 500.0000 mg | ORAL_TABLET | Freq: Every day | ORAL | Status: AC

## 2015-07-28 NOTE — Telephone Encounter (Signed)
Please review-aa 

## 2015-07-28 NOTE — Telephone Encounter (Signed)
Pt called back to see if another antibiotic would be called in today. Pt stated that the sulfamethoxazole-trimethoprim (BACTRIM DS,SEPTRA DS) 800-160 MG tablet didn't help and he finished it yesterday 07/27/15. Pt stated that the doxycycline (VIBRAMYCIN) 100 MG capsule makes him sick. Pt would like something other than those 2 medications sent to [facility]. Please advise. Thanks TNP

## 2015-07-28 NOTE — Telephone Encounter (Signed)
Patient was notified.

## 2015-07-28 NOTE — Telephone Encounter (Signed)
Try levoquin, have sent rx to pharmacy.

## 2015-08-03 DIAGNOSIS — H401131 Primary open-angle glaucoma, bilateral, mild stage: Secondary | ICD-10-CM | POA: Diagnosis not present

## 2015-08-03 DIAGNOSIS — Z961 Presence of intraocular lens: Secondary | ICD-10-CM | POA: Diagnosis not present

## 2015-08-04 ENCOUNTER — Other Ambulatory Visit: Payer: Self-pay | Admitting: Family Medicine

## 2015-08-07 ENCOUNTER — Other Ambulatory Visit: Payer: Self-pay | Admitting: Family Medicine

## 2015-08-07 DIAGNOSIS — Z8781 Personal history of (healed) traumatic fracture: Secondary | ICD-10-CM

## 2015-08-07 DIAGNOSIS — M549 Dorsalgia, unspecified: Secondary | ICD-10-CM

## 2015-08-07 DIAGNOSIS — M79606 Pain in leg, unspecified: Secondary | ICD-10-CM

## 2015-08-07 DIAGNOSIS — M25569 Pain in unspecified knee: Secondary | ICD-10-CM

## 2015-08-07 MED ORDER — FENTANYL 75 MCG/HR TD PT72: 75.0000 ug | MEDICATED_PATCH | TRANSDERMAL | Status: DC

## 2015-08-07 MED ORDER — HYDROCODONE-ACETAMINOPHEN 5-325 MG PO TABS: 1.0000 | ORAL_TABLET | Freq: Two times a day (BID) | ORAL | Status: DC

## 2015-08-07 NOTE — Telephone Encounter (Signed)
fentaNYL (DURAGESIC - DOSED MCG/HR) 75 MCG/HR and HYDROcodone-acetaminophen (NORCO/VICODIN) 5-325 MG tablet send into [facility]   Pt also stated he has finished the levofloxacin (LEVAQUIN) 500 MG tablet and his legs are still the same.

## 2015-08-09 ENCOUNTER — Telehealth: Payer: Self-pay | Admitting: Family Medicine

## 2015-08-09 NOTE — Telephone Encounter (Signed)
Patient reports are is still red and scaly denies pain or itching. Please advise. sd

## 2015-08-09 NOTE — Telephone Encounter (Signed)
Pt calling stating that he has finished all his antibiotics, he took his last one on Sunday, pt states legs are not better, pt would like to know what to do from here.  Thanks CC

## 2015-08-10 NOTE — Telephone Encounter (Signed)
Pt called back for an update about what he should try for his legs. I advised pt that Dr. Caryn Section was out of the office this afternoon. I spoke with Roshena and advised pt that she advised to keep his feet elevated for the swelling. Pt stated that when he gets up in the morning his legs are normal but as the day goes by they start to swell. Pt stated his legs aren't painful, no redness, or itching. Please advise. Thanks TNP

## 2015-08-11 ENCOUNTER — Other Ambulatory Visit: Payer: Self-pay | Admitting: Family Medicine

## 2015-08-11 ENCOUNTER — Other Ambulatory Visit: Payer: Self-pay | Admitting: *Deleted

## 2015-08-11 MED ORDER — MELATONIN 3 MG PO TABS: 1.0000 | ORAL_TABLET | Freq: Every day | ORAL | Status: DC

## 2015-08-11 MED ORDER — AZITHROMYCIN 250 MG PO TABS: ORAL_TABLET | ORAL | Status: AC

## 2015-08-11 NOTE — Progress Notes (Unsigned)
Have sent rx for azithromycin to [place]. If this doesn't clear it up he will need referral to a dermatologist.

## 2015-08-11 NOTE — Telephone Encounter (Signed)
Patient is requesting an antibiotic to be called into [facility]. Patient reports leg is still red and swollen not worsening. Patient denies fever, discharge or worsening symptoms. Patient was offered an appointment but reports that he can not come in today. sd

## 2015-08-11 NOTE — Progress Notes (Signed)
Patient was notified. Patient stated that he has an appt to see Dr. Nehemiah Massed 08/28/15 for sores on his head.

## 2015-08-17 ENCOUNTER — Other Ambulatory Visit: Payer: Self-pay | Admitting: Family Medicine

## 2015-08-18 ENCOUNTER — Other Ambulatory Visit: Payer: Self-pay | Admitting: Family Medicine

## 2015-08-18 DIAGNOSIS — R609 Edema, unspecified: Secondary | ICD-10-CM

## 2015-08-22 ENCOUNTER — Telehealth: Payer: Self-pay | Admitting: Family Medicine

## 2015-08-22 NOTE — Telephone Encounter (Signed)
Please advise 

## 2015-08-22 NOTE — Telephone Encounter (Signed)
Pt stated that his legs aren't better and wanted to know if Dr. Caryn Section thinks he should see Dr. Ralene Bathe or if pt should go see someone in [place]. Pt stated he values Dr. Maralyn Sago opinion and wanted to see what he thought would be best. Please advise. Thanks TNP

## 2015-08-23 NOTE — Telephone Encounter (Signed)
He should Dr. Nehemiah Massed look at them at his appointment next week.

## 2015-08-24 NOTE — Telephone Encounter (Signed)
Patient was notified. Expressed understanding.  

## 2015-08-28 ENCOUNTER — Other Ambulatory Visit: Payer: Self-pay | Admitting: Family Medicine

## 2015-08-28 DIAGNOSIS — L905 Scar conditions and fibrosis of skin: Secondary | ICD-10-CM | POA: Diagnosis not present

## 2015-08-28 DIAGNOSIS — M549 Dorsalgia, unspecified: Secondary | ICD-10-CM

## 2015-08-28 DIAGNOSIS — M79606 Pain in leg, unspecified: Secondary | ICD-10-CM

## 2015-08-28 DIAGNOSIS — I8312 Varicose veins of left lower extremity with inflammation: Secondary | ICD-10-CM | POA: Diagnosis not present

## 2015-08-28 DIAGNOSIS — C4442 Squamous cell carcinoma of skin of scalp and neck: Secondary | ICD-10-CM | POA: Diagnosis not present

## 2015-08-28 DIAGNOSIS — L821 Other seborrheic keratosis: Secondary | ICD-10-CM | POA: Diagnosis not present

## 2015-08-28 DIAGNOSIS — M25569 Pain in unspecified knee: Secondary | ICD-10-CM

## 2015-08-28 DIAGNOSIS — I8311 Varicose veins of right lower extremity with inflammation: Secondary | ICD-10-CM | POA: Diagnosis not present

## 2015-08-28 DIAGNOSIS — R21 Rash and other nonspecific skin eruption: Secondary | ICD-10-CM | POA: Diagnosis not present

## 2015-08-28 DIAGNOSIS — Z8781 Personal history of (healed) traumatic fracture: Secondary | ICD-10-CM

## 2015-08-28 DIAGNOSIS — D485 Neoplasm of uncertain behavior of skin: Secondary | ICD-10-CM | POA: Diagnosis not present

## 2015-08-28 MED ORDER — HYDROCODONE-ACETAMINOPHEN 5-325 MG PO TABS: 1.0000 | ORAL_TABLET | Freq: Two times a day (BID) | ORAL | 0 refills | Status: DC

## 2015-08-28 MED ORDER — FENTANYL 75 MCG/HR TD PT72: 75.0000 ug | MEDICATED_PATCH | TRANSDERMAL | 0 refills | Status: DC

## 2015-08-29 ENCOUNTER — Other Ambulatory Visit: Payer: Self-pay

## 2015-08-29 DIAGNOSIS — F411 Generalized anxiety disorder: Secondary | ICD-10-CM

## 2015-08-29 MED ORDER — ALPRAZOLAM 1 MG PO TABS: ORAL_TABLET | ORAL | 5 refills | Status: DC

## 2015-08-29 NOTE — Telephone Encounter (Signed)
Please call in alprazolam.  

## 2015-08-29 NOTE — Telephone Encounter (Signed)
Pharmacy is requesting refill. Thanks

## 2015-09-05 ENCOUNTER — Telehealth: Payer: Self-pay | Admitting: Family Medicine

## 2015-09-05 DIAGNOSIS — M6281 Muscle weakness (generalized): Secondary | ICD-10-CM | POA: Diagnosis not present

## 2015-09-05 DIAGNOSIS — I4891 Unspecified atrial fibrillation: Secondary | ICD-10-CM | POA: Diagnosis not present

## 2015-09-05 DIAGNOSIS — E039 Hypothyroidism, unspecified: Secondary | ICD-10-CM | POA: Diagnosis not present

## 2015-09-05 NOTE — Telephone Encounter (Signed)
Pt stated that he was advised that the results from his biopsy came back as squamous cell and he is to return in September and they would take care of the area. Pt wanted to make sure that Dr. Caryn Section was ok with this. Please advise. Thanks TNP

## 2015-09-06 NOTE — Telephone Encounter (Signed)
Patient was notified.

## 2015-09-06 NOTE — Telephone Encounter (Signed)
Yes, he definitely needs to keep follow up with dermatologist for this.

## 2015-09-07 ENCOUNTER — Other Ambulatory Visit: Payer: Self-pay | Admitting: Physician Assistant

## 2015-09-07 ENCOUNTER — Other Ambulatory Visit: Payer: Self-pay | Admitting: Family Medicine

## 2015-09-08 NOTE — Telephone Encounter (Signed)
error 

## 2015-09-14 ENCOUNTER — Other Ambulatory Visit: Payer: Self-pay | Admitting: *Deleted

## 2015-09-14 DIAGNOSIS — M25569 Pain in unspecified knee: Secondary | ICD-10-CM

## 2015-09-14 DIAGNOSIS — M549 Dorsalgia, unspecified: Secondary | ICD-10-CM

## 2015-09-14 DIAGNOSIS — M79606 Pain in leg, unspecified: Secondary | ICD-10-CM

## 2015-09-14 DIAGNOSIS — Z8781 Personal history of (healed) traumatic fracture: Secondary | ICD-10-CM

## 2015-09-14 MED ORDER — FENTANYL 75 MCG/HR TD PT72: 75.0000 ug | MEDICATED_PATCH | TRANSDERMAL | 0 refills | Status: DC

## 2015-09-14 MED ORDER — HYDROCODONE-ACETAMINOPHEN 5-325 MG PO TABS: 1.0000 | ORAL_TABLET | Freq: Two times a day (BID) | ORAL | 0 refills | Status: DC

## 2015-09-26 ENCOUNTER — Other Ambulatory Visit: Payer: Self-pay

## 2015-09-26 DIAGNOSIS — M549 Dorsalgia, unspecified: Secondary | ICD-10-CM

## 2015-09-26 DIAGNOSIS — M79606 Pain in leg, unspecified: Secondary | ICD-10-CM

## 2015-09-26 DIAGNOSIS — Z8781 Personal history of (healed) traumatic fracture: Secondary | ICD-10-CM

## 2015-09-26 DIAGNOSIS — M25569 Pain in unspecified knee: Secondary | ICD-10-CM

## 2015-09-26 MED ORDER — FENTANYL 75 MCG/HR TD PT72: 75.0000 ug | MEDICATED_PATCH | TRANSDERMAL | 0 refills | Status: DC

## 2015-09-26 MED ORDER — HYDROCODONE-ACETAMINOPHEN 5-325 MG PO TABS: 1.0000 | ORAL_TABLET | Freq: Two times a day (BID) | ORAL | 0 refills | Status: DC

## 2015-09-26 NOTE — Telephone Encounter (Signed)
Pt needs these prescriptions mailed to [facility].   Thanks,   -Mickel Baas

## 2015-09-29 ENCOUNTER — Other Ambulatory Visit: Payer: Self-pay | Admitting: Physician Assistant

## 2015-09-29 ENCOUNTER — Other Ambulatory Visit: Payer: Self-pay | Admitting: Family Medicine

## 2015-10-05 DIAGNOSIS — Z961 Presence of intraocular lens: Secondary | ICD-10-CM | POA: Diagnosis not present

## 2015-10-05 DIAGNOSIS — H401131 Primary open-angle glaucoma, bilateral, mild stage: Secondary | ICD-10-CM | POA: Diagnosis not present

## 2015-10-11 DIAGNOSIS — L570 Actinic keratosis: Secondary | ICD-10-CM | POA: Diagnosis not present

## 2015-10-11 DIAGNOSIS — L98499 Non-pressure chronic ulcer of skin of other sites with unspecified severity: Secondary | ICD-10-CM | POA: Diagnosis not present

## 2015-10-11 DIAGNOSIS — D485 Neoplasm of uncertain behavior of skin: Secondary | ICD-10-CM | POA: Diagnosis not present

## 2015-10-11 DIAGNOSIS — C4442 Squamous cell carcinoma of skin of scalp and neck: Secondary | ICD-10-CM | POA: Diagnosis not present

## 2015-10-16 ENCOUNTER — Other Ambulatory Visit: Payer: Self-pay | Admitting: *Deleted

## 2015-10-16 ENCOUNTER — Other Ambulatory Visit: Payer: Self-pay | Admitting: Family Medicine

## 2015-10-16 DIAGNOSIS — F411 Generalized anxiety disorder: Secondary | ICD-10-CM

## 2015-10-16 MED ORDER — ALPRAZOLAM 1 MG PO TABS: ORAL_TABLET | ORAL | 5 refills | Status: DC

## 2015-10-16 NOTE — Telephone Encounter (Signed)
Please call in alprazolam.  

## 2015-10-16 NOTE — Telephone Encounter (Signed)
Rx called in to pharmacy. 

## 2015-10-19 ENCOUNTER — Other Ambulatory Visit: Payer: Self-pay | Admitting: Family Medicine

## 2015-10-19 DIAGNOSIS — Z8781 Personal history of (healed) traumatic fracture: Secondary | ICD-10-CM

## 2015-10-19 DIAGNOSIS — M549 Dorsalgia, unspecified: Secondary | ICD-10-CM

## 2015-10-19 DIAGNOSIS — M79606 Pain in leg, unspecified: Secondary | ICD-10-CM

## 2015-10-19 DIAGNOSIS — M25569 Pain in unspecified knee: Secondary | ICD-10-CM

## 2015-10-19 NOTE — Telephone Encounter (Signed)
Pt contacted office for refill request on the following medications: 1. fentaNYL (DURAGESIC - DOSED MCG/HR) 75 MCG/HR   2. HYDROcodone-acetaminophen (NORCO/VICODIN) 5-325 MG tablet  Last written: 09/26/15 Last OV: 07/11/15  Pt stated that he was going ahead and requesting it to be mailed to [facility] early b/c he has to go to [facility] for eye surgery and he didn't want to forget to request the refills next week. Please advise. Thanks TNP

## 2015-10-20 MED ORDER — FENTANYL 75 MCG/HR TD PT72: 75.0000 ug | MEDICATED_PATCH | TRANSDERMAL | 0 refills | Status: DC

## 2015-10-20 MED ORDER — HYDROCODONE-ACETAMINOPHEN 5-325 MG PO TABS: 1.0000 | ORAL_TABLET | Freq: Two times a day (BID) | ORAL | 0 refills | Status: DC

## 2015-10-25 DIAGNOSIS — Z7901 Long term (current) use of anticoagulants: Secondary | ICD-10-CM | POA: Diagnosis not present

## 2015-10-25 DIAGNOSIS — F419 Anxiety disorder, unspecified: Secondary | ICD-10-CM | POA: Diagnosis not present

## 2015-10-25 DIAGNOSIS — E039 Hypothyroidism, unspecified: Secondary | ICD-10-CM | POA: Diagnosis not present

## 2015-10-25 DIAGNOSIS — Z961 Presence of intraocular lens: Secondary | ICD-10-CM | POA: Diagnosis not present

## 2015-10-25 DIAGNOSIS — Z7902 Long term (current) use of antithrombotics/antiplatelets: Secondary | ICD-10-CM | POA: Diagnosis not present

## 2015-10-25 DIAGNOSIS — M1990 Unspecified osteoarthritis, unspecified site: Secondary | ICD-10-CM | POA: Diagnosis not present

## 2015-10-25 DIAGNOSIS — K219 Gastro-esophageal reflux disease without esophagitis: Secondary | ICD-10-CM | POA: Diagnosis not present

## 2015-10-25 DIAGNOSIS — H401123 Primary open-angle glaucoma, left eye, severe stage: Secondary | ICD-10-CM | POA: Diagnosis not present

## 2015-10-25 DIAGNOSIS — F329 Major depressive disorder, single episode, unspecified: Secondary | ICD-10-CM | POA: Diagnosis not present

## 2015-10-25 DIAGNOSIS — Z79899 Other long term (current) drug therapy: Secondary | ICD-10-CM | POA: Diagnosis not present

## 2015-10-25 DIAGNOSIS — H401121 Primary open-angle glaucoma, left eye, mild stage: Secondary | ICD-10-CM | POA: Diagnosis not present

## 2015-10-25 DIAGNOSIS — I10 Essential (primary) hypertension: Secondary | ICD-10-CM | POA: Diagnosis not present

## 2015-11-06 ENCOUNTER — Telehealth: Payer: Self-pay | Admitting: Family Medicine

## 2015-11-06 NOTE — Telephone Encounter (Signed)
No answer at eye clinic, LMTCB on cell phone. Renaldo Fiddler, CMA

## 2015-11-06 NOTE — Telephone Encounter (Signed)
Advised. Renaldo Fiddler, CMA

## 2015-11-06 NOTE — Telephone Encounter (Signed)
Pratap Challa called with concern's on pt. Pt had eye surgery and was wanting to know about stopping a medication because of bleeding concern please return her call @ [phone] or her cell [phone]. Thanks CC.

## 2015-11-06 NOTE — Telephone Encounter (Signed)
Asking if it is okay to D/C Xarelto secondary to eye bleed? Renaldo Fiddler, CMA

## 2015-11-06 NOTE — Telephone Encounter (Signed)
Yes, he should take last dose of Xarelto 5 days before surgery, and resume the day after surgery.

## 2015-11-16 DIAGNOSIS — Z85828 Personal history of other malignant neoplasm of skin: Secondary | ICD-10-CM | POA: Diagnosis not present

## 2015-11-16 DIAGNOSIS — L578 Other skin changes due to chronic exposure to nonionizing radiation: Secondary | ICD-10-CM | POA: Diagnosis not present

## 2015-11-16 DIAGNOSIS — L570 Actinic keratosis: Secondary | ICD-10-CM | POA: Diagnosis not present

## 2015-11-17 ENCOUNTER — Other Ambulatory Visit: Payer: Self-pay

## 2015-11-17 DIAGNOSIS — F411 Generalized anxiety disorder: Secondary | ICD-10-CM

## 2015-11-17 MED ORDER — ALPRAZOLAM 1 MG PO TABS: ORAL_TABLET | ORAL | 5 refills | Status: DC

## 2015-11-17 NOTE — Telephone Encounter (Signed)
Pharmacy requesting refills.

## 2015-11-17 NOTE — Telephone Encounter (Signed)
Please call in alprazolam.  

## 2015-11-21 ENCOUNTER — Other Ambulatory Visit: Payer: Self-pay | Admitting: Family Medicine

## 2015-11-21 DIAGNOSIS — M79606 Pain in leg, unspecified: Secondary | ICD-10-CM

## 2015-11-21 DIAGNOSIS — Z8781 Personal history of (healed) traumatic fracture: Secondary | ICD-10-CM

## 2015-11-21 MED ORDER — HYDROCODONE-ACETAMINOPHEN 5-325 MG PO TABS: 1.0000 | ORAL_TABLET | Freq: Two times a day (BID) | ORAL | 0 refills | Status: DC

## 2015-11-21 MED ORDER — FENTANYL 75 MCG/HR TD PT72: 75.0000 ug | MEDICATED_PATCH | TRANSDERMAL | 0 refills | Status: DC

## 2015-11-21 NOTE — Telephone Encounter (Signed)
Rx called in to pharmacy. 

## 2015-11-21 NOTE — Telephone Encounter (Signed)
Pt contacted office for refill request on the following medications: 1. HYDROcodone-acetaminophen (NORCO/VICODIN) 5-325 MG tablet   2. fentaNYL (DURAGESIC - DOSED MCG/HR) 75 MCG/HR  Last written: 10/20/15 Last OV: 07/11/15 Pt would like them mailed to [place]. Please advise. Thanks TNP

## 2015-12-14 ENCOUNTER — Other Ambulatory Visit: Payer: Self-pay | Admitting: Family Medicine

## 2015-12-14 DIAGNOSIS — M79606 Pain in leg, unspecified: Secondary | ICD-10-CM

## 2015-12-14 DIAGNOSIS — Z8781 Personal history of (healed) traumatic fracture: Secondary | ICD-10-CM

## 2015-12-14 NOTE — Telephone Encounter (Signed)
Pt contacted office for refill request on the following medications: 1. HYDROcodone-acetaminophen (NORCO/VICODIN) 5-325 MG tablet  2. fentaNYL (DURAGESIC - DOSED MCG/HR) 75 MCG/HR Last OV: 11/21/15 Pt would like it mailed to [facility]. Please advise. Thanks TNP

## 2015-12-15 ENCOUNTER — Other Ambulatory Visit: Payer: Self-pay | Admitting: Family Medicine

## 2015-12-15 MED ORDER — HYDROCODONE-ACETAMINOPHEN 5-325 MG PO TABS: 1.0000 | ORAL_TABLET | Freq: Two times a day (BID) | ORAL | 0 refills | Status: DC

## 2015-12-15 MED ORDER — FENTANYL 75 MCG/HR TD PT72: 75.0000 ug | MEDICATED_PATCH | TRANSDERMAL | 0 refills | Status: DC

## 2015-12-25 ENCOUNTER — Other Ambulatory Visit: Payer: Self-pay | Admitting: Family Medicine

## 2016-01-04 ENCOUNTER — Other Ambulatory Visit: Payer: Self-pay | Admitting: *Deleted

## 2016-01-04 DIAGNOSIS — F411 Generalized anxiety disorder: Secondary | ICD-10-CM

## 2016-01-04 MED ORDER — ALPRAZOLAM 1 MG PO TABS: ORAL_TABLET | ORAL | 5 refills | Status: DC

## 2016-01-04 NOTE — Telephone Encounter (Signed)
Please call in alprazolam.  

## 2016-01-04 NOTE — Telephone Encounter (Signed)
Rx called in to pharmacy. 

## 2016-01-11 ENCOUNTER — Other Ambulatory Visit: Payer: Self-pay | Admitting: Family Medicine

## 2016-01-11 DIAGNOSIS — Z8781 Personal history of (healed) traumatic fracture: Secondary | ICD-10-CM

## 2016-01-11 DIAGNOSIS — M79606 Pain in leg, unspecified: Secondary | ICD-10-CM

## 2016-01-11 MED ORDER — HYDROCODONE-ACETAMINOPHEN 5-325 MG PO TABS: 1.0000 | ORAL_TABLET | Freq: Two times a day (BID) | ORAL | 0 refills | Status: DC

## 2016-01-11 MED ORDER — FENTANYL 75 MCG/HR TD PT72: 75.0000 ug | MEDICATED_PATCH | TRANSDERMAL | 0 refills | Status: DC

## 2016-01-11 NOTE — Telephone Encounter (Signed)
Pt contacted office for refill request on the following medications: 1. HYDROcodone-acetaminophen (NORCO/VICODIN) 5-325 MG tablet  2. fentaNYL (DURAGESIC - DOSED MCG/HR) 75 MCG/HR Last Rx: 12/15/15 Last OV: 07/11/15 Pt would like this mailed to [facility]. Please advise. Thanks TNP

## 2016-01-27 ENCOUNTER — Other Ambulatory Visit: Payer: Self-pay | Admitting: Family Medicine

## 2016-02-07 DIAGNOSIS — I8310 Varicose veins of unspecified lower extremity with inflammation: Secondary | ICD-10-CM | POA: Diagnosis not present

## 2016-02-07 DIAGNOSIS — L570 Actinic keratosis: Secondary | ICD-10-CM | POA: Diagnosis not present

## 2016-02-07 DIAGNOSIS — R234 Changes in skin texture: Secondary | ICD-10-CM | POA: Diagnosis not present

## 2016-02-08 ENCOUNTER — Other Ambulatory Visit: Payer: Self-pay | Admitting: Family Medicine

## 2016-02-08 DIAGNOSIS — M79606 Pain in leg, unspecified: Secondary | ICD-10-CM

## 2016-02-08 DIAGNOSIS — Z8781 Personal history of (healed) traumatic fracture: Secondary | ICD-10-CM

## 2016-02-08 NOTE — Telephone Encounter (Signed)
Please review. Annaelle Kasel Drozdowski, CMA  

## 2016-02-08 NOTE — Telephone Encounter (Signed)
Pt contacted office for refill request on the following medications: 1. HYDROcodone-acetaminophen (NORCO/VICODIN) 5-325 MG tablet  2. fentaNYL (DURAGESIC - DOSED MCG/HR) 75 MCG/HR Last Rx: 01/11/16 Pt would like it sent to [facility] in the mail. Please advise. Thanks TNP

## 2016-02-09 MED ORDER — FENTANYL 75 MCG/HR TD PT72: 75.0000 ug | MEDICATED_PATCH | TRANSDERMAL | 0 refills | Status: DC

## 2016-02-09 MED ORDER — HYDROCODONE-ACETAMINOPHEN 5-325 MG PO TABS: 1.0000 | ORAL_TABLET | Freq: Two times a day (BID) | ORAL | 0 refills | Status: DC

## 2016-02-12 ENCOUNTER — Other Ambulatory Visit: Payer: Self-pay | Admitting: Family Medicine

## 2016-02-12 DIAGNOSIS — F411 Generalized anxiety disorder: Secondary | ICD-10-CM

## 2016-02-13 NOTE — Telephone Encounter (Signed)
Please call in alprazolam.  

## 2016-02-13 NOTE — Telephone Encounter (Signed)
Rx called in to pharmacy. 

## 2016-02-19 ENCOUNTER — Other Ambulatory Visit: Payer: Self-pay | Admitting: Family Medicine

## 2016-02-19 DIAGNOSIS — R609 Edema, unspecified: Secondary | ICD-10-CM

## 2016-02-23 ENCOUNTER — Other Ambulatory Visit: Payer: Self-pay | Admitting: Family Medicine

## 2016-03-05 DIAGNOSIS — Z961 Presence of intraocular lens: Secondary | ICD-10-CM | POA: Diagnosis not present

## 2016-03-05 DIAGNOSIS — H18899 Other specified disorders of cornea, unspecified eye: Secondary | ICD-10-CM | POA: Diagnosis not present

## 2016-03-05 DIAGNOSIS — H401131 Primary open-angle glaucoma, bilateral, mild stage: Secondary | ICD-10-CM | POA: Diagnosis not present

## 2016-03-06 ENCOUNTER — Other Ambulatory Visit: Payer: Self-pay | Admitting: Family Medicine

## 2016-03-06 DIAGNOSIS — Z8781 Personal history of (healed) traumatic fracture: Secondary | ICD-10-CM

## 2016-03-06 DIAGNOSIS — M79606 Pain in leg, unspecified: Secondary | ICD-10-CM

## 2016-03-06 MED ORDER — FENTANYL 75 MCG/HR TD PT72: 75.0000 ug | MEDICATED_PATCH | TRANSDERMAL | 0 refills | Status: DC

## 2016-03-06 MED ORDER — HYDROCODONE-ACETAMINOPHEN 5-325 MG PO TABS: 1.0000 | ORAL_TABLET | Freq: Two times a day (BID) | ORAL | 0 refills | Status: DC

## 2016-03-06 NOTE — Telephone Encounter (Signed)
Pt contacted office for refill request on the following medications: 1. HYDROcodone-acetaminophen (NORCO/VICODIN) 5-325 MG tablet  2. fentaNYL (DURAGESIC - DOSED MCG/HR) 75 MCG/HR  Last Rx: 02/08/16 & 02/09/16 Please mail to [place]  Pt also stated that he has gotten even more bad news about his son that has brain cancer. The cancer has spread and don't think his son has much longer. Pt stated that he is having trouble dealing with this and wanted to see if there was something stronger than the ALPRAZolam (XANAX) 1 MG tablet. Please advise. Thanks TNP

## 2016-03-06 NOTE — Telephone Encounter (Signed)
Please advise. See message below.  

## 2016-03-13 ENCOUNTER — Other Ambulatory Visit: Payer: Self-pay | Admitting: Family Medicine

## 2016-03-14 ENCOUNTER — Other Ambulatory Visit: Payer: Self-pay | Admitting: Family Medicine

## 2016-03-14 DIAGNOSIS — F411 Generalized anxiety disorder: Secondary | ICD-10-CM

## 2016-03-14 NOTE — Telephone Encounter (Signed)
Rx called in to pharmacy. 

## 2016-03-14 NOTE — Telephone Encounter (Signed)
Please call in alprazolam.  

## 2016-03-14 NOTE — Telephone Encounter (Signed)
LOV 07/11/2015. Last refill 02/13/2016

## 2016-03-14 NOTE — Telephone Encounter (Signed)
Pt wanted to make sure we received the refill request for ALPRAZolam (XANAX) 1 MG tablet from [place]. Thanks TNP

## 2016-03-21 DIAGNOSIS — I8310 Varicose veins of unspecified lower extremity with inflammation: Secondary | ICD-10-CM | POA: Diagnosis not present

## 2016-03-21 DIAGNOSIS — L570 Actinic keratosis: Secondary | ICD-10-CM | POA: Diagnosis not present

## 2016-03-21 DIAGNOSIS — L578 Other skin changes due to chronic exposure to nonionizing radiation: Secondary | ICD-10-CM | POA: Diagnosis not present

## 2016-03-21 DIAGNOSIS — L98499 Non-pressure chronic ulcer of skin of other sites with unspecified severity: Secondary | ICD-10-CM | POA: Diagnosis not present

## 2016-03-26 DIAGNOSIS — H401131 Primary open-angle glaucoma, bilateral, mild stage: Secondary | ICD-10-CM | POA: Diagnosis not present

## 2016-03-26 DIAGNOSIS — Z961 Presence of intraocular lens: Secondary | ICD-10-CM | POA: Diagnosis not present

## 2016-04-01 ENCOUNTER — Other Ambulatory Visit: Payer: Self-pay | Admitting: Family Medicine

## 2016-04-02 DIAGNOSIS — I4891 Unspecified atrial fibrillation: Secondary | ICD-10-CM | POA: Diagnosis not present

## 2016-04-02 DIAGNOSIS — M6281 Muscle weakness (generalized): Secondary | ICD-10-CM | POA: Diagnosis not present

## 2016-04-02 DIAGNOSIS — E039 Hypothyroidism, unspecified: Secondary | ICD-10-CM | POA: Diagnosis not present

## 2016-04-08 ENCOUNTER — Other Ambulatory Visit: Payer: Self-pay | Admitting: Family Medicine

## 2016-04-08 MED ORDER — FENTANYL 75 MCG/HR TD PT72: 75.0000 ug | MEDICATED_PATCH | TRANSDERMAL | 0 refills | Status: DC

## 2016-04-08 NOTE — Telephone Encounter (Signed)
Please advise 

## 2016-04-08 NOTE — Telephone Encounter (Signed)
Pt requesting a refill of fentaNYL (DURAGESIC - DOSED MCG/HR) 75 MCG/HR and HYDROcodone-acetaminophen (NORCO/VICODIN) 5-325 MG tablet sent to his mail order pharmacy.

## 2016-04-10 ENCOUNTER — Ambulatory Visit (INDEPENDENT_AMBULATORY_CARE_PROVIDER_SITE_OTHER): Payer: PPO

## 2016-04-10 VITALS — BP 118/68 | HR 76 | Temp 99.0°F | Ht 60.0 in | Wt 143.2 lb

## 2016-04-10 DIAGNOSIS — Z0000 Encounter for general adult medical examination without abnormal findings: Secondary | ICD-10-CM | POA: Diagnosis not present

## 2016-04-10 NOTE — Patient Instructions (Signed)

## 2016-04-10 NOTE — Progress Notes (Signed)
Subjective:   David Mcmillan is a 83 y.o. male who presents for an Initial Medicare Annual Wellness Visit.  Review of Systems  N/A  Cardiac Risk Factors include: advanced age (>63men, >6 women);male gender;smoking/ tobacco exposure;hypertension    Objective:    Today's Vitals   04/10/16 1439 04/10/16 1445  BP: 118/68   Pulse: 76   Temp: 99 F (37.2 C)   TempSrc: Oral   Weight: 143 lb 3.2 oz (65 kg)   Height: 5' (1.524 m)   PainSc: 4  4    Body mass index is 27.97 kg/m.  Current Medications (verified) Outpatient Encounter Prescriptions as of 04/10/2016  Medication Sig  . acetaminophen (TYLENOL) 325 MG tablet Take 2 tablets by mouth every 6 (six) hours as needed. For pain  . ALPRAZolam (XANAX) 1 MG tablet TAKE ONE TABLET BY MOUTH 2-3 TIMES A DAYAND AT BEDTIME  . clobetasol ointment (TEMOVATE) 0.05 %   . clotrimazole-betamethasone (LOTRISONE) cream Apply 1 application topically 2 (two) times daily.  Marland Kitchen DOK 100 MG capsule TAKE TWO CAPSULES BY MOUTH TWICE DAILY AS DIRECTED  . fentaNYL (DURAGESIC - DOSED MCG/HR) 75 MCG/HR Place 1 patch (75 mcg total) onto the skin every 3 (three) days.  . ferrous sulfate 325 (65 FE) MG tablet Take 1 tablet by mouth daily.  . finasteride (PROSCAR) 5 MG tablet TAKE ONE (1) TABLET EACH DAY  . furosemide (LASIX) 20 MG tablet TAKE ONE (1) TABLET BY MOUTH EVERY DAY FOR SWELLING  . levothyroxine (SYNTHROID, LEVOTHROID) 88 MCG tablet TAKE ONE (1) TABLET BY MOUTH EVERY DAY  . Melatonin 3 MG TABS Take 1 tablet (3 mg total) by mouth at bedtime.  . meloxicam (MOBIC) 15 MG tablet Take 1 tablet by mouth daily.  . metoprolol (LOPRESSOR) 50 MG tablet TAKE ONE-HALF TABLET BY MOUTH TWICE DAILY AS DIRECTED  . rOPINIRole (REQUIP) 1 MG tablet TAKE ONE TABLET DAILY AT BEDTIME  . ROZEREM 8 MG tablet TAKE ONE TABLET BY MOUTH EVERY NIGHT AT BEDTIME  . traZODone (DESYREL) 100 MG tablet TAKE ONE (1) TABLET THREE (3) TIMES EACH DAY  . venlafaxine XR (EFFEXOR-XR) 75 MG 24  hr capsule TAKE THREE CAPSULES BY MOUTH DAILY AS DIRECTED  . XARELTO 15 MG TABS tablet TAKE ONE (1) TABLET EACH DAY  . dorzolamide (TRUSOPT) 2 % ophthalmic solution   . HYDROcodone-acetaminophen (NORCO/VICODIN) 5-325 MG tablet Take 1 tablet by mouth 2 (two) times daily. (Patient not taking: Reported on 04/10/2016)  . ketoconazole (NIZORAL) 2 % cream Apply 1 application topically daily. (Patient not taking: Reported on 04/10/2016)  . latanoprost (XALATAN) 0.005 % ophthalmic solution Apply to eye.  . magic mouthwash SOLN Swish and spit out 10 mL three times daily as needed (Patient not taking: Reported on 04/10/2016)  . mupirocin ointment (BACTROBAN) 2 % APPLY TO AFFECTED AREAS TWICE A DAY AS NEEDED (Patient not taking: Reported on 04/10/2016)  . pantoprazole (PROTONIX) 40 MG tablet TAKE ONE (1) TABLET BY MOUTH EVERY DAY (Patient not taking: Reported on 04/10/2016)  . silver sulfADIAZINE (SILVADENE) 1 % cream Apply 1 application topically 2 (two) times daily.  . tamsulosin (FLOMAX) 0.4 MG CAPS capsule TAKE ONE (1) CAPSULE EACH DAY  . tiZANidine (ZANAFLEX) 4 MG tablet Take 1 tablet by mouth every 6 (six) hours as needed.  . triamcinolone cream (KENALOG) 0.1 % APPLY TO AFFECTED AREAS TWICE DAILY AS NEEDED (Patient not taking: Reported on 04/10/2016)   No facility-administered encounter medications on file as of 04/10/2016.  Allergies (verified) Invanz  [ertapenem]   History: Past Medical History:  Diagnosis Date  . Anemia   . Anxiety   . Glaucoma   . History of chicken pox   . History of measles   . Hypertension   . Irritable bowel syndrome   . Restless leg syndrome    Past Surgical History:  Procedure Laterality Date  . APPENDECTOMY  2008  . BASAL CELL CARCINOMA EXCISION     MOH's [facility]. Onalee Hua [facility]  . HIP FRACTURE SURGERY Right 2010   Dr. Sabra Heck; [facility]  . THYROIDECTOMY  1960  . TONSILLECTOMY AND ADENOIDECTOMY  1930  . UPPER GI ENDOSCOPY  04/07/2011   [place]  Grade D reflux. Black gastric fluid. Blood blot in Duodenal Bulb   Family History  Problem Relation Age of Onset  . Cancer Mother   . Leukemia Father   . Atrial fibrillation Brother     has pacemaker   Social History   Occupational History  . retired    Social History Main Topics  . Smoking status: Current Every Day Smoker    Packs/day: 0.02    Types: Cigarettes  . Smokeless tobacco: Never Used     Comment: 2-3 cigarettes daily  . Alcohol use No  . Drug use: No  . Sexual activity: Not on file   Tobacco Counseling Ready to quit: Not Answered Counseling given: Not Answered   Activities of Daily Living In your present state of health, do you have any difficulty performing the following activities: 04/10/2016  Hearing? Y  Vision? Y  Difficulty concentrating or making decisions? N  Walking or climbing stairs? Y  Dressing or bathing? N  Doing errands, shopping? N  Preparing Food and eating ? Y  Using the Toilet? N  In the past six months, have you accidently leaked urine? N  Do you have problems with loss of bowel control? N  Managing your Medications? N  Managing your Finances? N  Housekeeping or managing your Housekeeping? Y  Some recent data might be hidden    Immunizations and Health Maintenance Immunization History  Administered Date(s) Administered  . Influenza-Unspecified 11/05/2014  . Pneumococcal Conjugate-13 08/25/2013  . Pneumococcal Polysaccharide-23 09/30/2000  . Td 06/15/2010  . Zoster 01/07/2008   There are no preventive care reminders to display for this patient.  Patient Care Team: Birdie Sons, MD as PCP - General (Family Medicine) Wallace Cullens, MD as Referring Physician (Ophthalmology) Ralene Bathe, MD as Consulting Physician (Dermatology)  Indicate any recent Medical Services you may have received from other than [facility] providers in the past year (date may be approximate).    Assessment:   This is a routine wellness examination for  David Mcmillan.  Hearing/Vision screen Vision Screening Comments: Pt sees Dr Kandis Fantasia for vision checks monthly.  Dietary issues and exercise activities discussed: Current Exercise Habits: The patient does not participate in regular exercise at present  Goals    . Increase water intake          Recommend increasing water intake to 4 glasses of water a day.      Depression Screen PHQ 2/9 Scores 04/10/2016  PHQ - 2 Score 1    Fall Risk Fall Risk  04/10/2016  Falls in the past year? No    Cognitive Function:     6CIT Screen 04/10/2016  What Year? 0 points  What month? 0 points  What time? 3 points  Count back from 20 0 points  Months in reverse 0 points  Repeat phrase 4 points  Total Score 7    Screening Tests Health Maintenance  Topic Date Due  . TETANUS/TDAP  06/14/2020  . INFLUENZA VACCINE  Completed  . PNA vac Low Risk Adult  Completed        Plan:  I have personally reviewed and addressed the Medicare Annual Wellness questionnaire and have noted the following in the patient's chart:  A. Medical and social history B. Use of alcohol, tobacco or illicit drugs  C. Current medications and supplements D. Functional ability and status E.  Nutritional status F.  Physical activity G. Advance directives H. List of other physicians I.  Hospitalizations, surgeries, and ER visits in previous 12 months J.  [place] such as hearing and vision if needed, cognitive and depression L. Referrals and appointments - none  In addition, I have reviewed and discussed with patient certain preventive protocols, quality metrics, and best practice recommendations. A written personalized care plan for preventive services as well as general preventive health recommendations were provided to patient.  See attached scanned questionnaire for additional information.   Signed,  Fabio Neighbors, LPN Nurse Health Advisor   MD Recommendations: None.  I have reviewed the health  advisor's note, was available for consultation, and agree with documentation and plan  Lelon Huh, MD

## 2016-04-15 ENCOUNTER — Other Ambulatory Visit: Payer: Self-pay | Admitting: Family Medicine

## 2016-04-15 DIAGNOSIS — M79606 Pain in leg, unspecified: Secondary | ICD-10-CM

## 2016-04-15 DIAGNOSIS — Z8781 Personal history of (healed) traumatic fracture: Secondary | ICD-10-CM

## 2016-04-16 ENCOUNTER — Encounter: Payer: PPO | Admitting: Family Medicine

## 2016-04-17 ENCOUNTER — Encounter: Payer: Self-pay | Admitting: Family Medicine

## 2016-04-17 ENCOUNTER — Ambulatory Visit (INDEPENDENT_AMBULATORY_CARE_PROVIDER_SITE_OTHER): Payer: PPO | Admitting: Family Medicine

## 2016-04-17 VITALS — BP 110/70 | HR 80 | Temp 98.3°F | Resp 16

## 2016-04-17 DIAGNOSIS — I10 Essential (primary) hypertension: Secondary | ICD-10-CM

## 2016-04-17 DIAGNOSIS — I4891 Unspecified atrial fibrillation: Secondary | ICD-10-CM

## 2016-04-17 DIAGNOSIS — E890 Postprocedural hypothyroidism: Secondary | ICD-10-CM

## 2016-04-17 DIAGNOSIS — M1990 Unspecified osteoarthritis, unspecified site: Secondary | ICD-10-CM | POA: Diagnosis not present

## 2016-04-17 DIAGNOSIS — M810 Age-related osteoporosis without current pathological fracture: Secondary | ICD-10-CM | POA: Diagnosis not present

## 2016-04-17 DIAGNOSIS — F411 Generalized anxiety disorder: Secondary | ICD-10-CM | POA: Diagnosis not present

## 2016-04-17 DIAGNOSIS — D649 Anemia, unspecified: Secondary | ICD-10-CM | POA: Diagnosis not present

## 2016-04-17 NOTE — Progress Notes (Signed)
Patient: David Mcmillan, Male    DOB: [birth year], 83 y.o.   MRN: [id number] Visit Date: 04/17/2016  Today's Provider: Lelon Huh, MD   Chief Complaint  Patient presents with  . Follow-up  . Hypertension  . Anxiety  . Depression  . Hypothyroidism  . Atrial Fibrillation   Subjective:   He today for follow up of chronic problems after AWV with [facility] earlier today. He is generally feeling better the last few months, although has been very concerned about his son going through treatment for brain cancer at [facility].    Hypertension, follow-up:  BP Readings from Last 3 Encounters:  04/17/16 110/70  04/10/16 118/68  07/11/15 (!) 112/58    He was last seen for hypertension no recent visits.  BP at that visit was n/a. Management since that visit includes; no recent visits.He reports good compliance with treatment. He is not having side effects. none He is not exercising. He is adherent to low salt diet.   Outside blood pressures are n/a. He is experiencing none.  Patient denies none.   Cardiovascular risk factors include none.  Use of agents associated with hypertension: none.   ----------------------------------------------------------------  Atrial Fibrillation Continues on Xarelto for CVA prophylaxis and metoprolol for rate control. No chest pains, dyspnea, or palpitations. Tolerating medications well.   Hypothyroidism Last in 2015 was 3.33. Sleeping relatively well   Anxiety  Continues on venlafaxine. Also continue on alprazolam which he usually takes four times a day including hs. Denies feeling fatigued or sleepy during the day.       Review of Systems  Constitutional: Negative for appetite change, chills and fever.  Respiratory: Negative for chest tightness, shortness of breath and wheezing.   Cardiovascular: Negative for chest pain and palpitations.  Gastrointestinal: Negative for abdominal pain, nausea and vomiting.  All other systems reviewed and are  negative.   Social History      He  reports that he has been smoking Cigarettes.  He has been smoking about 0.02 packs per day. He has never used smokeless tobacco. He reports that he does not drink alcohol or use drugs.       Social History   Social History  . Marital status: Widowed    Spouse name: N/A  . Number of children: 2  . Years of education: N/A   Occupational History  . retired    Social History Main Topics  . Smoking status: Current Every Day Smoker    Packs/day: 0.02    Types: Cigarettes  . Smokeless tobacco: Never Used     Comment: 2-3 cigarettes daily  . Alcohol use No  . Drug use: No  . Sexual activity: Not Asked   Other Topics Concern  . None   Social History Narrative   Patient Lives at [facility].   Patient son's live in [place] and rarely visit    Past Medical History:  Diagnosis Date  . Anemia   . Anxiety   . Glaucoma   . History of chicken pox   . History of measles   . Hypertension   . Irritable bowel syndrome   . Restless leg syndrome      Patient Active Problem List   Diagnosis Date Noted  . Absolute anemia 09/13/2014  . Atrial fibrillation ([place]) 09/13/2014  . Back pain 09/13/2014  . BPH (benign prostatic hyperplasia) 09/13/2014  . Arthritis, degenerative 09/13/2014  . Depression 09/13/2014  . Dizziness 09/13/2014  . Fatigue 09/13/2014  .  H/O: glaucoma 09/13/2014  . Guaiac + stool 09/13/2014  . H/O fracture of hip 09/13/2014  . Hyponatremia 09/13/2014  . Knee pain 09/13/2014  . Leg pain 09/13/2014  . Pedal edema 09/13/2014  . Pseudogout of left knee 09/13/2014  . Acute stress disorder 09/13/2014  . Restless leg syndrome 09/13/2014  . Glaucoma 09/13/2014  . Constipation 08/18/2014  . Malignant neoplasm of scalp and skin of neck 07/27/2009  . Chronic pulmonary heart disease ([place]) 05/20/2009  . Arthralgia of hip or thigh 03/19/2009  . Dysphagia 02/21/2009  . Insomnia 11/09/2008  . Closed fracture of multiple ribs  08/01/2008  . Edema 06/08/2008  . Stenosis of rectum and anus 05/16/2008  . Abnormal prostate specific antigen 02/05/2004  . Senile osteoporosis 02/05/2004  . Anxiety disorder 02/04/1998  . Essential (primary) hypertension 02/04/1998  . Irritable bowel syndrome 02/04/1998  . Malignant melanoma of skin of lower limb, including hip ([place]) 02/04/1998  . Raynaud's syndrome 02/04/1998  . Hypothyroidism, postop 02/05/1964    Past Surgical History:  Procedure Laterality Date  . APPENDECTOMY  2008  . BASAL CELL CARCINOMA EXCISION     MOH's surgery by Dr. Onalee Hua at [facility]  . HIP FRACTURE SURGERY Right 2010   Dr. Sabra Heck; [facility]  . THYROIDECTOMY  1960  . TONSILLECTOMY AND ADENOIDECTOMY  1930  . UPPER GI ENDOSCOPY  04/07/2011   LA Grade D reflux. Black gastric fluid. Blood blot in Duodenal Bulb    Family History        Family Status  Relation Status  . Mother Deceased at age 56   Tongue cancer that spread to her throat  . Father Deceased at age 51  . Brother Alive        His family history includes Atrial fibrillation in his brother; Cancer in his mother; Leukemia in his father.     Allergies  Allergen Reactions  . Invanz  [Ertapenem]     Seizure     Current Outpatient Prescriptions:  .  acetaminophen (TYLENOL) 325 MG tablet, Take 2 tablets by mouth every 6 (six) hours as needed. For pain, Disp: , Rfl:  .  ALPRAZolam (XANAX) 1 MG tablet, TAKE ONE TABLET BY MOUTH 2-3 TIMES A DAYAND AT BEDTIME, Disp: 28 tablet, Rfl: 5 .  clobetasol ointment (TEMOVATE) 0.05 %, , Disp: , Rfl:  .  clotrimazole-betamethasone (LOTRISONE) cream, Apply 1 application topically 2 (two) times daily., Disp: , Rfl:  .  DOK 100 MG capsule, TAKE TWO CAPSULES BY MOUTH TWICE DAILY AS DIRECTED, Disp: 120 capsule, Rfl: 12 .  dorzolamide (TRUSOPT) 2 % ophthalmic solution, , Disp: , Rfl:  .  fentaNYL (DURAGESIC - DOSED MCG/HR) 75 MCG/HR, Place 1 patch (75 mcg total) onto the skin every 3 (three)  days., Disp: 10 patch, Rfl: 0 .  ferrous sulfate 325 (65 FE) MG tablet, Take 1 tablet by mouth daily., Disp: , Rfl:  .  finasteride (PROSCAR) 5 MG tablet, TAKE ONE (1) TABLET EACH DAY, Disp: 30 tablet, Rfl: 12 .  furosemide (LASIX) 20 MG tablet, TAKE ONE (1) TABLET BY MOUTH EVERY DAY FOR SWELLING, Disp: 30 tablet, Rfl: 5 .  HYDROcodone-acetaminophen (NORCO/VICODIN) 5-325 MG tablet, TAKE ONE (1) TABLET BY MOUTH TWO (2) TIMES DAILY, Disp: 60 tablet, Rfl: 0 .  ketoconazole (NIZORAL) 2 % cream, Apply 1 application topically daily., Disp: 45 g, Rfl: 1 .  latanoprost (XALATAN) 0.005 % ophthalmic solution, Apply to eye., Disp: , Rfl:  .  levothyroxine (SYNTHROID, LEVOTHROID) 88 MCG  tablet, TAKE ONE (1) TABLET BY MOUTH EVERY DAY, Disp: 30 tablet, Rfl: 12 .  magic mouthwash SOLN, Swish and spit out 10 mL three times daily as needed, Disp: 300 mL, Rfl: 3 .  Melatonin 3 MG TABS, Take 1 tablet (3 mg total) by mouth at bedtime., Disp: 30 tablet, Rfl: 5 .  meloxicam (MOBIC) 15 MG tablet, Take 1 tablet by mouth daily., Disp: , Rfl:  .  metoprolol (LOPRESSOR) 50 MG tablet, TAKE ONE-HALF TABLET BY MOUTH TWICE DAILY AS DIRECTED, Disp: 30 tablet, Rfl: 12 .  mupirocin ointment (BACTROBAN) 2 %, APPLY TO AFFECTED AREAS TWICE A DAY AS NEEDED, Disp: 22 g, Rfl: 4 .  pantoprazole (PROTONIX) 40 MG tablet, TAKE ONE (1) TABLET BY MOUTH EVERY DAY, Disp: 30 tablet, Rfl: 12 .  rOPINIRole (REQUIP) 1 MG tablet, TAKE ONE TABLET DAILY AT BEDTIME, Disp: 30 tablet, Rfl: 12 .  ROZEREM 8 MG tablet, TAKE ONE TABLET BY MOUTH EVERY NIGHT AT BEDTIME, Disp: 30 tablet, Rfl: 12 .  silver sulfADIAZINE (SILVADENE) 1 % cream, Apply 1 application topically 2 (two) times daily., Disp: , Rfl:  .  tamsulosin (FLOMAX) 0.4 MG CAPS capsule, TAKE ONE (1) CAPSULE EACH DAY, Disp: 30 capsule, Rfl: 5 .  tiZANidine (ZANAFLEX) 4 MG tablet, Take 1 tablet by mouth every 6 (six) hours as needed., Disp: , Rfl:  .  traZODone (DESYREL) 100 MG tablet, TAKE ONE (1)  TABLET THREE (3) TIMES EACH DAY, Disp: 90 tablet, Rfl: 5 .  triamcinolone cream (KENALOG) 0.1 %, APPLY TO AFFECTED AREAS TWICE DAILY AS NEEDED, Disp: 120 g, Rfl: 1 .  venlafaxine XR (EFFEXOR-XR) 75 MG 24 hr capsule, TAKE THREE CAPSULES BY MOUTH DAILY AS DIRECTED, Disp: 90 capsule, Rfl: 4 .  XARELTO 15 MG TABS tablet, TAKE ONE (1) TABLET EACH DAY, Disp: 30 tablet, Rfl: 12   Patient Care Team: Birdie Sons, MD as PCP - General (Family Medicine) Wallace Cullens, MD as Referring Physician (Ophthalmology) Ralene Bathe, MD as Consulting Physician (Dermatology)      Objective:   Vitals: BP 110/70 (BP Location: Left Arm, Patient Position: Sitting, Cuff Size: Normal)   Pulse 80   Temp 98.3 F (36.8 C) (Oral)   Resp 16       Physical Exam   General Appearance:    Alert, cooperative, no distress  Eyes:    PERRL, conjunctiva/corneas clear, EOM's intact       Lungs:     Clear to auscultation bilaterally, respirations unlabored  Heart:    Irregularly irregular rhythm. Normal rate   Neurologic:   Awake, alert, oriented x 3. No apparent focal neurological           defect.   MS:  Extremely kyphotic    Depression Screen PHQ 2/9 Scores 04/10/2016  PHQ - 2 Score 1      Assessment & Plan:     1. Atrial fibrillation, unspecified type ([place]) Rate controlled Continue current anticoagulation - CBC  2. Essential (primary) hypertension Well controlled.  Continue current medications.   - Renal function panel  3. Hypothyroidism, postop  - T4 AND TSH  4. Osteoarthritis, unspecified osteoarthritis type, unspecified site Pain adequately controlled.   5. Anxiety/depression Continue current dose Effexor and alprazolam.      Lelon Huh, MD  [facility]

## 2016-04-18 LAB — CBC
Hematocrit: 31.6 % — ABNORMAL LOW (ref 37.5–51.0)
Hemoglobin: 10.6 g/dL — ABNORMAL LOW (ref 13.0–17.7)
MCH: 31.4 pg (ref 26.6–33.0)
MCHC: 33.5 g/dL (ref 31.5–35.7)
MCV: 94 fL (ref 79–97)
PLATELETS: 172 10*3/uL (ref 150–379)
RBC: 3.38 x10E6/uL — ABNORMAL LOW (ref 4.14–5.80)
RDW: 14 % (ref 12.3–15.4)
WBC: 6.8 10*3/uL (ref 3.4–10.8)

## 2016-04-18 LAB — RENAL FUNCTION PANEL
Albumin: 3.8 g/dL (ref 3.5–4.7)
BUN/Creatinine Ratio: 29 — ABNORMAL HIGH (ref 10–24)
BUN: 24 mg/dL (ref 8–27)
CALCIUM: 8.7 mg/dL (ref 8.6–10.2)
CHLORIDE: 95 mmol/L — AB (ref 96–106)
CO2: 30 mmol/L — AB (ref 18–29)
Creatinine, Ser: 0.82 mg/dL (ref 0.76–1.27)
GFR calc Af Amer: 95 mL/min/{1.73_m2} (ref >59)
GFR calc non Af Amer: 82 mL/min/{1.73_m2} (ref >59)
Glucose: 88 mg/dL (ref 65–99)
Phosphorus: 3.8 mg/dL (ref 2.5–4.5)
Potassium: 4.7 mmol/L (ref 3.5–5.2)
SODIUM: 136 mmol/L (ref 134–144)

## 2016-04-18 LAB — T4 AND TSH
T4, Total: 7.8 ug/dL (ref 4.5–12.0)
TSH: 1.74 u[IU]/mL (ref 0.450–4.500)

## 2016-04-20 LAB — B12 AND FOLATE PANEL
Folate: >20 ng/mL (ref >3.0)
VITAMIN B 12: 751 pg/mL (ref 232–1245)

## 2016-04-20 LAB — FERRITIN: Ferritin: 31 ng/mL (ref 30–400)

## 2016-04-20 LAB — SPECIMEN STATUS REPORT

## 2016-04-22 ENCOUNTER — Other Ambulatory Visit: Payer: Self-pay | Admitting: Family Medicine

## 2016-04-22 DIAGNOSIS — F411 Generalized anxiety disorder: Secondary | ICD-10-CM

## 2016-04-22 NOTE — Telephone Encounter (Signed)
Please call in alprazolam.  

## 2016-04-22 NOTE — Telephone Encounter (Signed)
Rx called in to pharmacy. 

## 2016-04-29 ENCOUNTER — Other Ambulatory Visit: Payer: Self-pay

## 2016-04-29 ENCOUNTER — Telehealth: Payer: Self-pay | Admitting: Family Medicine

## 2016-04-29 MED ORDER — MELATONIN 3 MG PO TABS
1.0000 | ORAL_TABLET | Freq: Every day | ORAL | 5 refills | Status: AC
Start: 2016-04-29 — End: ?

## 2016-04-29 NOTE — Telephone Encounter (Signed)
Please advise. Thanks.  

## 2016-04-29 NOTE — Telephone Encounter (Signed)
Pt stated that he has noticed when he bends down his nose starts bleeding. Pt stated that it doesn't take long to get it to stop but last night out of no where he noticed his lower leg was bleeding and there wasn't an injury that he knows of. Pt thinks that maybe this is being caused by him taking XARELTO 15 MG TABS tablet. Pt wanted to know if he should go to lower dosage of the medication. Please advise. Thanks TNP

## 2016-04-29 NOTE — Telephone Encounter (Signed)
Pharmacy requesting refills. Thanks!  

## 2016-04-30 NOTE — Telephone Encounter (Signed)
15 mg is already a low dose for xarelto for a-fib. Recommend he stay on 15mg  a day. The alternative is warfarin in which case he would have to come in at least once a month for blood test.  Also, he is mid ly anemic and has low iron level. He should take daily iron supplement, 325mg  twice a day.

## 2016-05-01 ENCOUNTER — Other Ambulatory Visit: Payer: Self-pay

## 2016-05-01 MED ORDER — FERROUS SULFATE 325 (65 FE) MG PO TABS: 325.0000 mg | ORAL_TABLET | Freq: Two times a day (BID) | ORAL | 12 refills | Status: AC

## 2016-05-01 NOTE — Telephone Encounter (Signed)
Patient advised  ED 

## 2016-05-02 ENCOUNTER — Other Ambulatory Visit: Payer: Self-pay | Admitting: Family Medicine

## 2016-05-02 MED ORDER — FENTANYL 75 MCG/HR TD PT72: 75.0000 ug | MEDICATED_PATCH | TRANSDERMAL | 0 refills | Status: DC

## 2016-05-07 ENCOUNTER — Telehealth: Payer: Self-pay | Admitting: Family Medicine

## 2016-05-07 DIAGNOSIS — M79606 Pain in leg, unspecified: Secondary | ICD-10-CM

## 2016-05-07 DIAGNOSIS — Z8781 Personal history of (healed) traumatic fracture: Secondary | ICD-10-CM

## 2016-05-07 NOTE — Telephone Encounter (Signed)
Pt contacted office for refill request on the following medications: Mail to  [place] [facility].  CB#[phone]/MW   HYDROcodone-acetaminophen (NORCO/VICODIN) 5-325 MG tab  fentaNYL (DURAGESIC - DOSED MCG/HR) 75 MCG/HR

## 2016-05-08 MED ORDER — HYDROCODONE-ACETAMINOPHEN 5-325 MG PO TABS: ORAL_TABLET | ORAL | 0 refills | Status: DC

## 2016-05-08 NOTE — Telephone Encounter (Signed)
Please advise refill for hydrocodone? Rx for fentanyl has been printed by Dr.Fisher.

## 2016-05-08 NOTE — Telephone Encounter (Signed)
Pt called to see if the Rx has been mailed to [facility]. Pt stated that [facility] delivers the medications and he wants to make sure they get the Rx and they won't fill medications until it's due.  HYDROcodone-acetaminophen (NORCO/VICODIN) 5-325 MG tablet Last Rx: 04/16/16 Please advise. Thanks TNP

## 2016-05-08 NOTE — Telephone Encounter (Signed)
Rx printed to be mailed

## 2016-05-09 ENCOUNTER — Other Ambulatory Visit: Payer: Self-pay | Admitting: Physician Assistant

## 2016-05-13 ENCOUNTER — Telehealth: Payer: Self-pay | Admitting: Family Medicine

## 2016-05-13 NOTE — Telephone Encounter (Signed)
Pt is having constipation and is asking for a Rx to help with this.  [facility].  CB#[phone]/MW

## 2016-05-14 NOTE — Telephone Encounter (Signed)
ADVISED  ED 

## 2016-05-14 NOTE — Telephone Encounter (Signed)
Best thing is OTC Milk of magnesia 1-2 tablespoon twice a day and OTC docusate 100mg  twice a day. Can also stop iron supplements for a few days until having normal bowel movements.

## 2016-05-15 ENCOUNTER — Other Ambulatory Visit: Payer: Self-pay | Admitting: Family Medicine

## 2016-05-15 MED ORDER — MAGIC MOUTHWASH: ORAL | 3 refills | Status: DC

## 2016-05-15 NOTE — Telephone Encounter (Signed)
[facility] faxed a request for the following.  Thanks CC  magic mouthwash SOLN  Swish and spit out 10 ML three times a day as directed.

## 2016-05-15 NOTE — Telephone Encounter (Signed)
Rx called in to pharmacy. 

## 2016-05-16 DIAGNOSIS — H31302 Unspecified choroidal hemorrhage, left eye: Secondary | ICD-10-CM | POA: Diagnosis not present

## 2016-05-16 DIAGNOSIS — Z961 Presence of intraocular lens: Secondary | ICD-10-CM | POA: Diagnosis not present

## 2016-05-16 DIAGNOSIS — H401131 Primary open-angle glaucoma, bilateral, mild stage: Secondary | ICD-10-CM | POA: Diagnosis not present

## 2016-05-22 DIAGNOSIS — M79675 Pain in left toe(s): Secondary | ICD-10-CM | POA: Diagnosis not present

## 2016-05-22 DIAGNOSIS — Q6689 Other  specified congenital deformities of feet: Secondary | ICD-10-CM | POA: Diagnosis not present

## 2016-05-22 DIAGNOSIS — M7751 Other enthesopathy of right foot: Secondary | ICD-10-CM | POA: Diagnosis not present

## 2016-05-22 DIAGNOSIS — M79674 Pain in right toe(s): Secondary | ICD-10-CM | POA: Diagnosis not present

## 2016-05-22 DIAGNOSIS — B351 Tinea unguium: Secondary | ICD-10-CM | POA: Diagnosis not present

## 2016-05-24 ENCOUNTER — Other Ambulatory Visit: Payer: Self-pay | Admitting: Family Medicine

## 2016-05-29 ENCOUNTER — Other Ambulatory Visit: Payer: Self-pay | Admitting: Family Medicine

## 2016-05-29 DIAGNOSIS — M79606 Pain in leg, unspecified: Secondary | ICD-10-CM

## 2016-05-29 DIAGNOSIS — Z8781 Personal history of (healed) traumatic fracture: Secondary | ICD-10-CM

## 2016-05-29 NOTE — Telephone Encounter (Signed)
Pt called wanting [place] to fax his 2 pain rx's to [facility] as usual.  traZODone (DESYREL) 100 MG tablet  HYDROcodone-acetaminophen (NORCO/VICODIN) 5-325 MG tablet  Thanks teri

## 2016-05-30 MED ORDER — HYDROCODONE-ACETAMINOPHEN 5-325 MG PO TABS: ORAL_TABLET | ORAL | 0 refills | Status: DC

## 2016-05-30 MED ORDER — TRAZODONE HCL 100 MG PO TABS: 100.0000 mg | ORAL_TABLET | Freq: Three times a day (TID) | ORAL | 6 refills | Status: DC

## 2016-05-30 NOTE — Telephone Encounter (Signed)
Rx called in to pharmacy. 

## 2016-05-31 ENCOUNTER — Other Ambulatory Visit: Payer: Self-pay | Admitting: Family Medicine

## 2016-05-31 DIAGNOSIS — F411 Generalized anxiety disorder: Secondary | ICD-10-CM

## 2016-06-01 NOTE — Telephone Encounter (Signed)
Please call in alprazolam.  

## 2016-06-03 ENCOUNTER — Other Ambulatory Visit: Payer: Self-pay | Admitting: Family Medicine

## 2016-06-03 NOTE — Telephone Encounter (Signed)
Rx called in to pharmacy. 

## 2016-06-03 NOTE — Telephone Encounter (Signed)
Pt contacted office for refill request on the following medications:  fentaNYL (DURAGESIC - DOSED MCG/HR) 75 MCG/HR.  Mail to [facility] [place].  CB#[phone]/MW

## 2016-06-04 MED ORDER — FENTANYL 75 MCG/HR TD PT72: 75.0000 ug | MEDICATED_PATCH | TRANSDERMAL | 0 refills | Status: DC

## 2016-06-05 IMAGING — CR DG CHEST 2V
2 series · 2 of 2 positions shown · non-contrast
Comparison: Chest radiograph performed 04/06/2011

CLINICAL DATA: Bruising across the chest, status post procedure.
Initial encounter.

EXAM:
CHEST  2 VIEW

[chest pa]
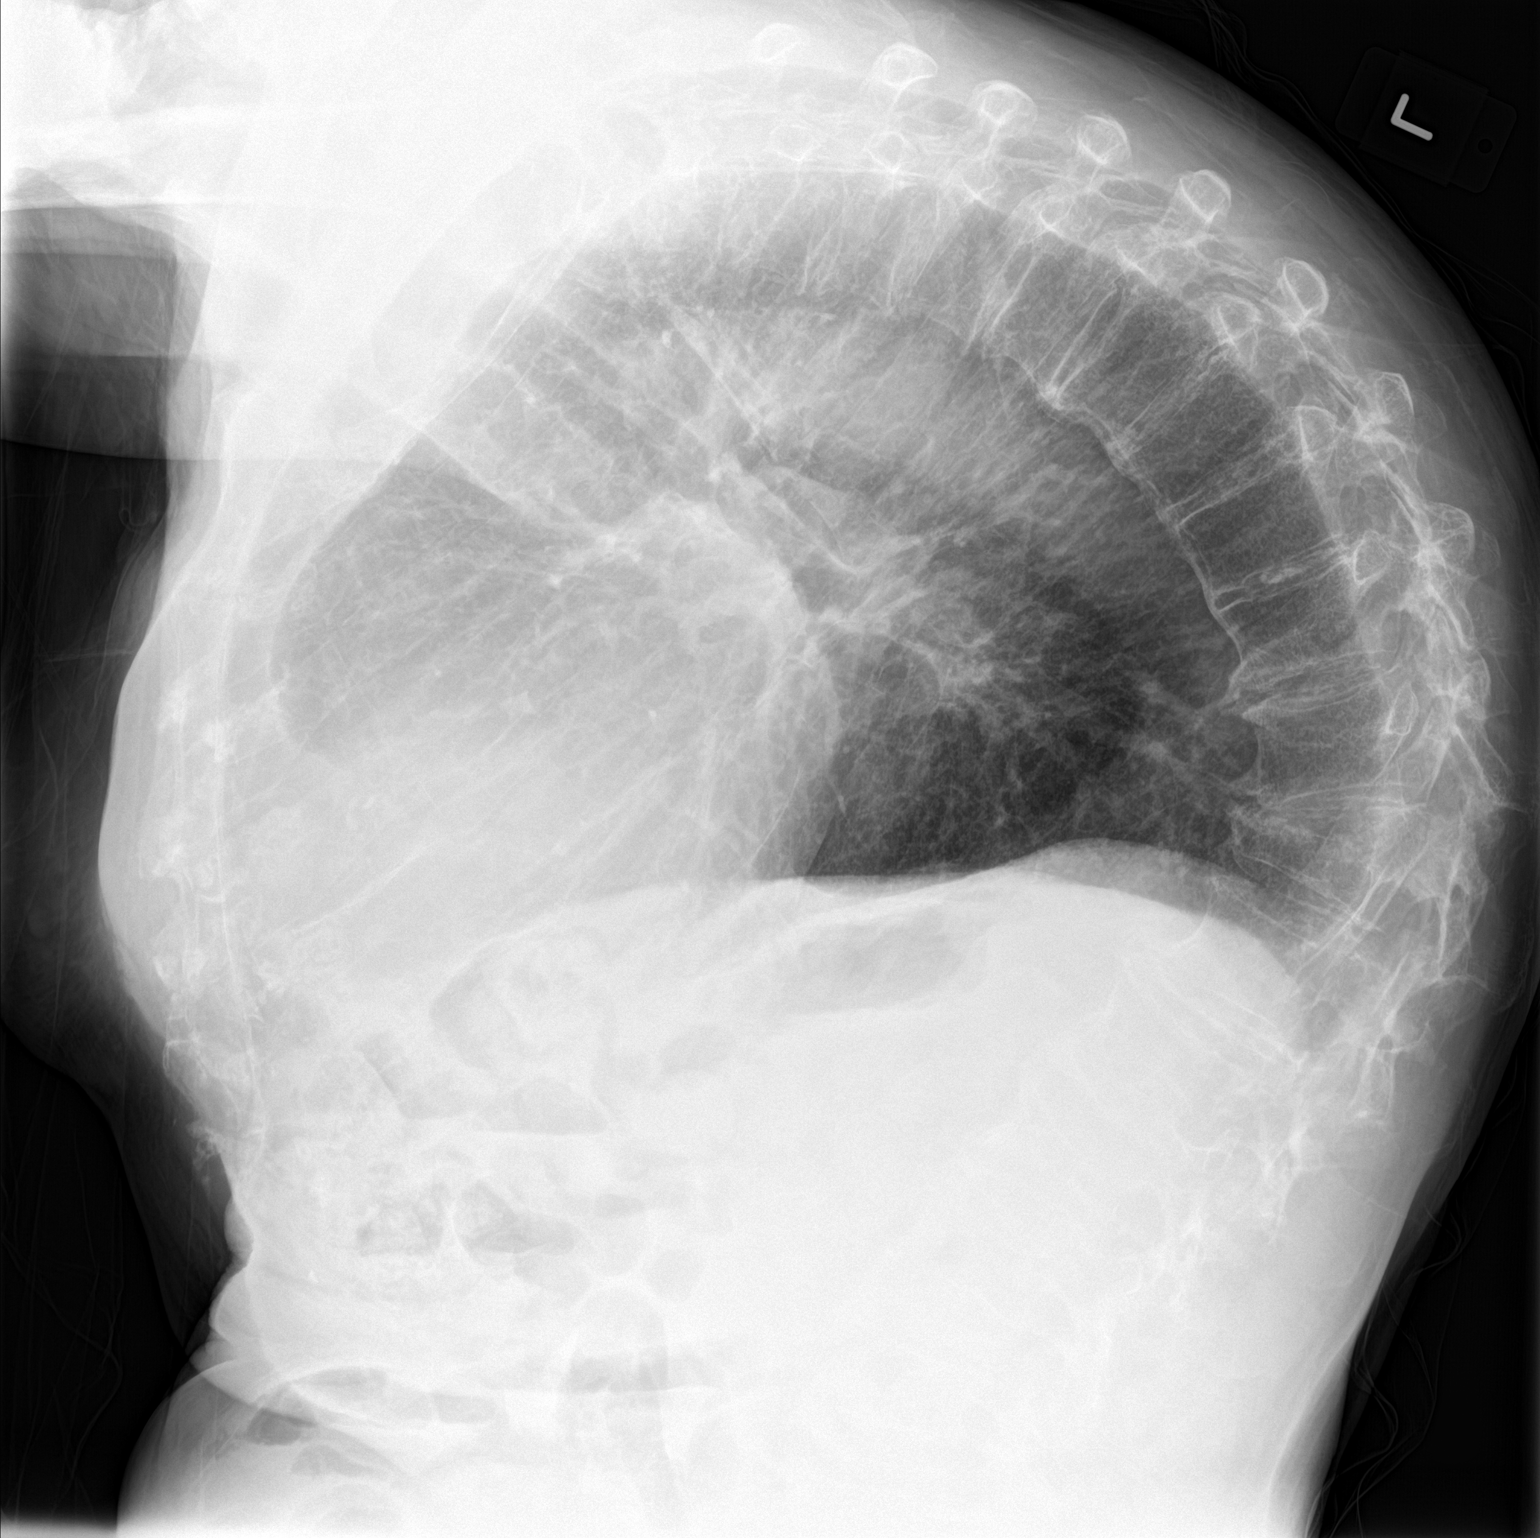

[chest ap]
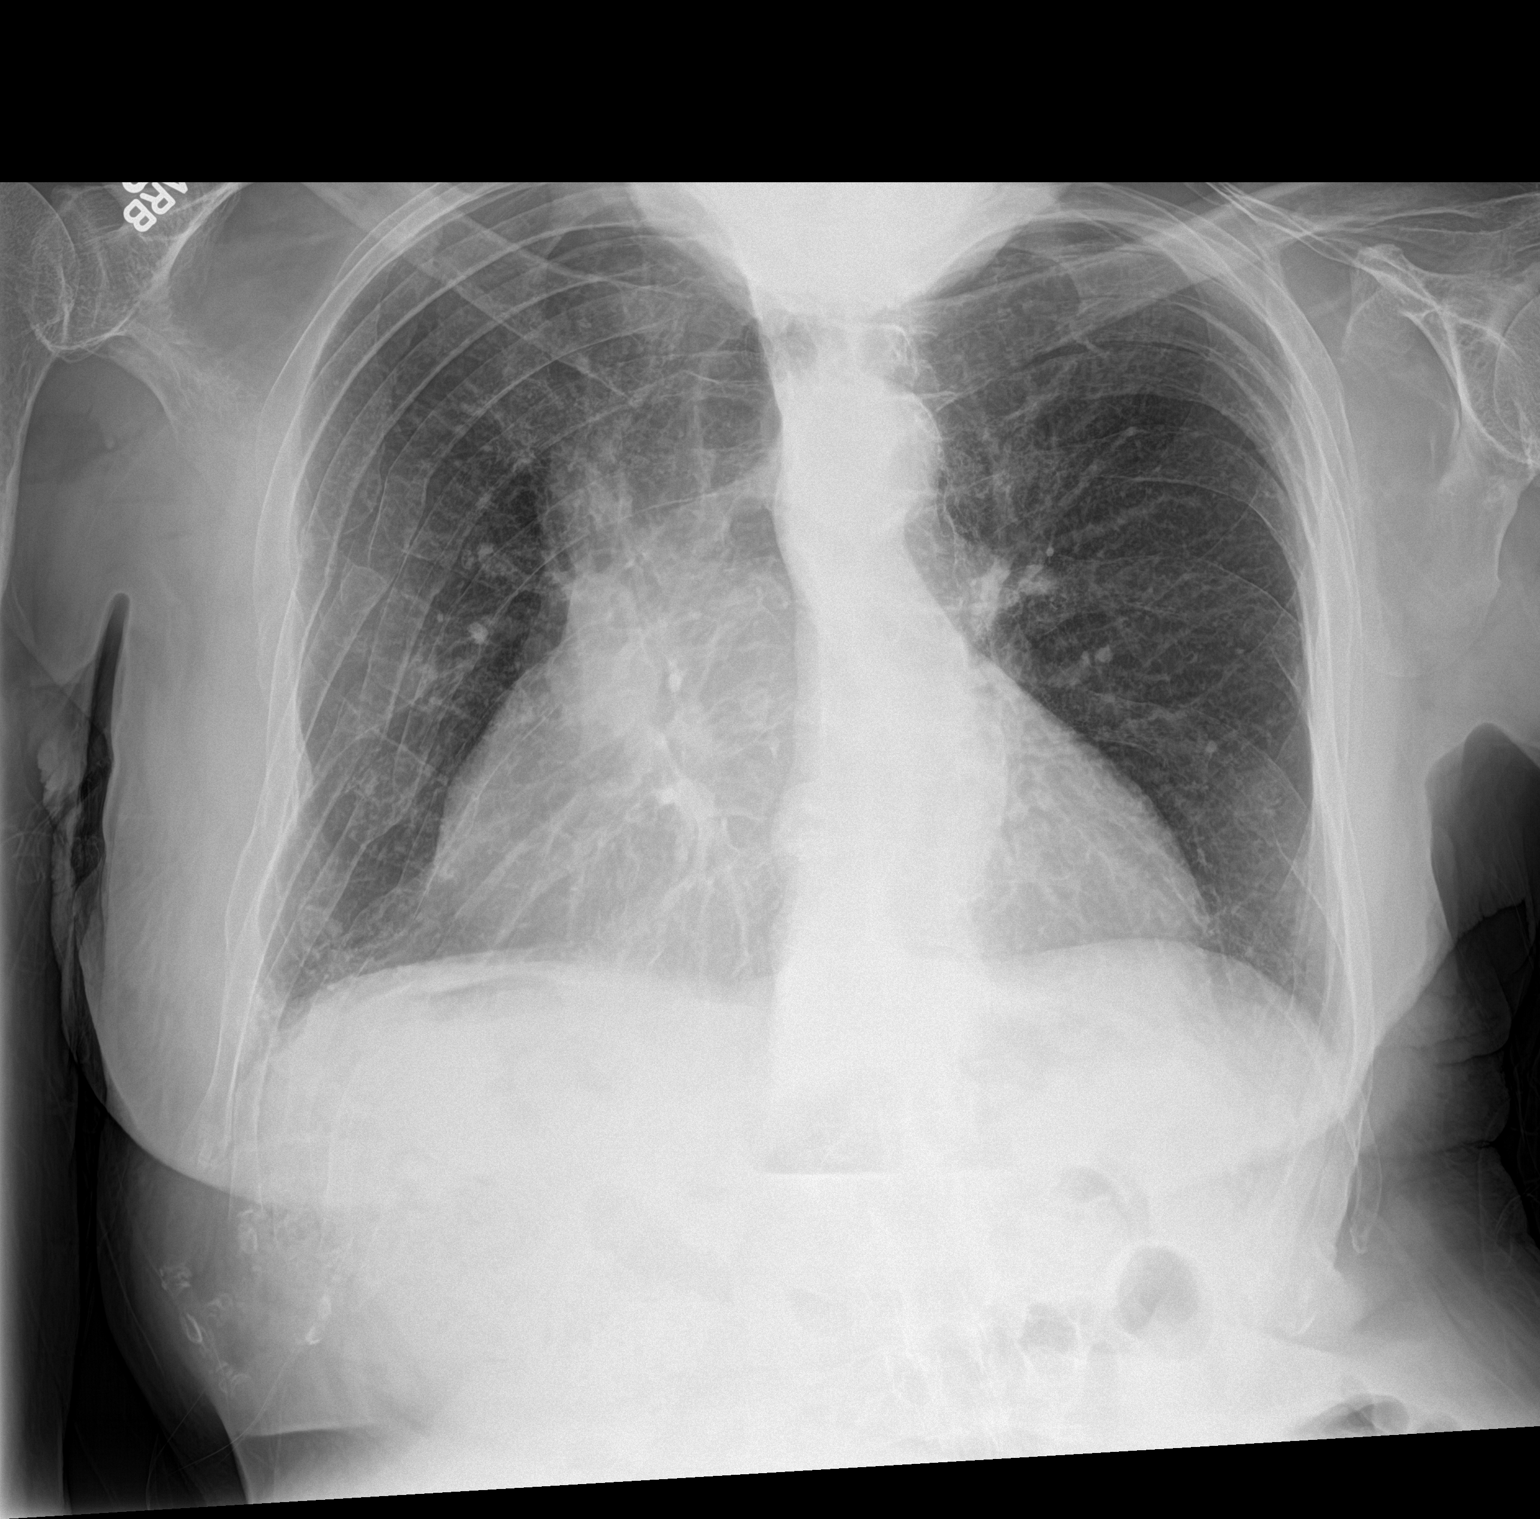

[2 of 2 positions shown; findings below may reference images not displayed]

FINDINGS: The lungs are well-aerated and clear. There is no evidence of focal
opacification, pleural effusion or pneumothorax.

The heart is significantly enlarged. Chronic right-sided rib
deformities appear grossly stable from the prior study. There is
focal kyphosis at the lower thoracic spine due to a chronic
compression deformity.
IMPRESSION: No acute cardiopulmonary process seen.  Cardiomegaly noted.

## 2016-06-17 DIAGNOSIS — L570 Actinic keratosis: Secondary | ICD-10-CM | POA: Diagnosis not present

## 2016-06-17 DIAGNOSIS — Z85828 Personal history of other malignant neoplasm of skin: Secondary | ICD-10-CM | POA: Diagnosis not present

## 2016-06-17 DIAGNOSIS — I8310 Varicose veins of unspecified lower extremity with inflammation: Secondary | ICD-10-CM | POA: Diagnosis not present

## 2016-06-17 DIAGNOSIS — L578 Other skin changes due to chronic exposure to nonionizing radiation: Secondary | ICD-10-CM | POA: Diagnosis not present

## 2016-06-25 DIAGNOSIS — H4010X2 Unspecified open-angle glaucoma, moderate stage: Secondary | ICD-10-CM | POA: Diagnosis not present

## 2016-06-25 DIAGNOSIS — H401131 Primary open-angle glaucoma, bilateral, mild stage: Secondary | ICD-10-CM | POA: Diagnosis not present

## 2016-07-02 ENCOUNTER — Other Ambulatory Visit: Payer: Self-pay | Admitting: Family Medicine

## 2016-07-02 DIAGNOSIS — Z8781 Personal history of (healed) traumatic fracture: Secondary | ICD-10-CM

## 2016-07-02 DIAGNOSIS — M79606 Pain in leg, unspecified: Secondary | ICD-10-CM

## 2016-07-02 MED ORDER — FENTANYL 75 MCG/HR TD PT72: 75.0000 ug | MEDICATED_PATCH | TRANSDERMAL | 0 refills | Status: DC

## 2016-07-02 MED ORDER — HYDROCODONE-ACETAMINOPHEN 5-325 MG PO TABS: ORAL_TABLET | ORAL | 0 refills | Status: DC

## 2016-07-02 NOTE — Telephone Encounter (Signed)
Pt contacted office for refill request on the following medications: 1. HYDROcodone-acetaminophen (NORCO/VICODIN) 5-325 MG tablet  Last Rx: 05/30/16 Last OV: 04/17/16 2. fentaNYL (DURAGESIC - DOSED MCG/HR) 75 MCG/HR Last Rx: 06/04/16 Mail to Clair Gulling at [facility]. Please advise. Thanks TNP

## 2016-07-04 ENCOUNTER — Other Ambulatory Visit: Payer: Self-pay | Admitting: Family Medicine

## 2016-07-04 DIAGNOSIS — F411 Generalized anxiety disorder: Secondary | ICD-10-CM

## 2016-07-04 NOTE — Telephone Encounter (Signed)
Please call in alprazolam.  

## 2016-07-19 ENCOUNTER — Other Ambulatory Visit: Payer: Self-pay | Admitting: Family Medicine

## 2016-07-22 NOTE — Telephone Encounter (Signed)
Please advise 

## 2016-07-22 NOTE — Telephone Encounter (Signed)
Pt wants to know if Dr. Caryn Section would refer him to a good place to get some dentures.  He has some he has had for a while but they do not fit well.  His call back is [phone]  Thanks Con Memos

## 2016-07-25 NOTE — Telephone Encounter (Signed)
Any dentist can take care of this for him. I really don't know that any of them are better than the others.

## 2016-07-25 NOTE — Telephone Encounter (Signed)
Patient was notified.

## 2016-07-30 ENCOUNTER — Other Ambulatory Visit: Payer: Self-pay | Admitting: Family Medicine

## 2016-07-30 DIAGNOSIS — Z8781 Personal history of (healed) traumatic fracture: Secondary | ICD-10-CM

## 2016-07-30 DIAGNOSIS — M79606 Pain in leg, unspecified: Secondary | ICD-10-CM

## 2016-07-30 NOTE — Telephone Encounter (Signed)
Pt contacted office for refill request on the following medications: 1. fentaNYL (DURAGESIC - DOSED MCG/HR) 75 MCG/HR   2. HYDROcodone-acetaminophen (NORCO/VICODIN) 5-325 MG tablet Last Rx: 07/02/16 Please advise. Thanks TNP

## 2016-07-31 MED ORDER — HYDROCODONE-ACETAMINOPHEN 5-325 MG PO TABS: ORAL_TABLET | ORAL | 0 refills | Status: DC

## 2016-07-31 MED ORDER — FENTANYL 75 MCG/HR TD PT72: 75.0000 ug | MEDICATED_PATCH | TRANSDERMAL | 0 refills | Status: DC

## 2016-08-09 ENCOUNTER — Other Ambulatory Visit: Payer: Self-pay | Admitting: Family Medicine

## 2016-08-09 DIAGNOSIS — N400 Enlarged prostate without lower urinary tract symptoms: Secondary | ICD-10-CM

## 2016-08-14 ENCOUNTER — Encounter: Payer: Self-pay | Admitting: Family Medicine

## 2016-08-14 ENCOUNTER — Ambulatory Visit (INDEPENDENT_AMBULATORY_CARE_PROVIDER_SITE_OTHER): Payer: PPO | Admitting: Family Medicine

## 2016-08-14 VITALS — BP 100/58 | HR 94 | Temp 98.0°F | Resp 18

## 2016-08-14 DIAGNOSIS — I4891 Unspecified atrial fibrillation: Secondary | ICD-10-CM

## 2016-08-14 MED ORDER — APIXABAN 2.5 MG PO TABS: 2.5000 mg | ORAL_TABLET | Freq: Two times a day (BID) | ORAL | 5 refills | Status: DC

## 2016-08-14 NOTE — Progress Notes (Signed)
Patient: David Mcmillan Male    DOB: 11-Dec-1932   83 y.o.   MRN: [id number] Visit Date: 08/14/2016  Today's Provider: Lelon Huh, MD   Chief Complaint  Patient presents with  . Atrial Fibrillation    follow up  . Hypertension    follow up  . Hypothyroidism    follow up  . Anxiety    follow up  . Osteoarthritis    follow up   Subjective:    HPI Follow up of Atrial Fibrillation:  Patient was last seen for this problem 4 months ago and no changes were made. Patient reports fair compliance with treatment and fair tolerance. Patient states since taking Xarelto he has had symptoms of nose bleeds and coughing up blood a couple of times, but very infrequently. Patient is concerned that he may be bleeding internally without knowing it. He would like to be switched to Eliquis.Has had no abdominal pains. No change in frequency, consistency or color of stool. No nausea or vomiting.    Hypertension, follow-up:  BP Readings from Last 3 Encounters:  08/14/16 (!) 100/58  04/17/16 110/70  04/10/16 118/68    He was last seen for hypertension 4 months ago.  BP at that visit was 110/70. Management since that visit includes no changes. He reports good compliance with treatment. He is not having side effects.  He is not exercising. He is adherent to low salt diet.   Outside blood pressures are not being checked. He is experiencing none.  Patient denies chest pain, chest pressure/discomfort, claudication, dyspnea, exertional chest pressure/discomfort, fatigue, irregular heart beat, near-syncope, orthopnea, palpitations, paroxysmal nocturnal dyspnea, syncope and tachypnea.   Cardiovascular risk factors include advanced age (older than 36 for men, 5 for women), hypertension, male gender and smoking/ tobacco exposure.  Use of agents associated with hypertension: thyroid hormones.     Weight trend: stable Wt Readings from Last 3 Encounters:  04/10/16 143 lb 3.2 oz (65 kg)    07/11/15 161 lb (73 kg)  06/10/14 154 lb (69.9 kg)    Current diet: well balanced  ------------------------------------------------------------------------ Follow up of Hypothyroidism:  Patient was last seen for this problem 4 months ago and no changes were made. Patient reports good compliance with treatment, and good tolerance.  Follow up of Osteoarthritis:  Patient was last seen for this problem 4 months ago and no changes were made. Patient reports good compliance with treatment, good tolerance and good symptom control.  Follow up of Anxiety:  Patient was last seen for this problem 4 months ago and no changes were made. Patient reports good compliance with treatment, good tolerance and good symptom control.       Allergies  Allergen Reactions  . Invanz  [Ertapenem]     Seizure     Current Outpatient Prescriptions:  .  acetaminophen (TYLENOL) 325 MG tablet, Take 2 tablets by mouth every 6 (six) hours as needed. For pain, Disp: , Rfl:  .  ALPRAZolam (XANAX) 1 MG tablet, TAKE ONE TABLET BY MOUTH 2-3 TIMES DAILYAND AT BEDTIME., Disp: 28 tablet, Rfl: 4 .  clobetasol ointment (TEMOVATE) 0.05 %, , Disp: , Rfl:  .  clotrimazole-betamethasone (LOTRISONE) cream, Apply 1 application topically 2 (two) times daily., Disp: , Rfl:  .  DOK 100 MG capsule, TAKE TWO CAPSULES BY MOUTH TWICE DAILY AS DIRECTED, Disp: 120 capsule, Rfl: 12 .  dorzolamide (TRUSOPT) 2 % ophthalmic solution, , Disp: , Rfl:  .  fentaNYL (DURAGESIC -  DOSED MCG/HR) 75 MCG/HR, Place 1 patch (75 mcg total) onto the skin every 3 (three) days., Disp: 10 patch, Rfl: 0 .  ferrous sulfate 325 (65 FE) MG tablet, Take 1 tablet (325 mg total) by mouth 2 (two) times daily with a meal., Disp: 60 tablet, Rfl: 12 .  finasteride (PROSCAR) 5 MG tablet, TAKE ONE (1) TABLET EACH DAY, Disp: 30 tablet, Rfl: 12 .  furosemide (LASIX) 20 MG tablet, TAKE ONE (1) TABLET BY MOUTH EVERY DAY FOR SWELLING, Disp: 30 tablet, Rfl: 5 .   HYDROcodone-acetaminophen (NORCO/VICODIN) 5-325 MG tablet, TAKE ONE (1) TABLET BY MOUTH TWO (2) TIMES DAILY, Disp: 60 tablet, Rfl: 0 .  ketoconazole (NIZORAL) 2 % cream, Apply 1 application topically daily., Disp: 45 g, Rfl: 1 .  latanoprost (XALATAN) 0.005 % ophthalmic solution, Apply to eye., Disp: , Rfl:  .  levothyroxine (SYNTHROID, LEVOTHROID) 88 MCG tablet, TAKE ONE (1) TABLET BY MOUTH EVERY DAY, Disp: 30 tablet, Rfl: 12 .  magic mouthwash SOLN, Swish and spit out 10 mL three times daily as needed, Disp: 300 mL, Rfl: 3 .  Melatonin 3 MG TABS, Take 1 tablet (3 mg total) by mouth at bedtime., Disp: 30 tablet, Rfl: 5 .  meloxicam (MOBIC) 15 MG tablet, Take 1 tablet by mouth daily., Disp: , Rfl:  .  metoprolol (LOPRESSOR) 50 MG tablet, TAKE ONE-HALF TABLET BY MOUTH TWICE DAILY AS DIRECTED, Disp: 30 tablet, Rfl: 12 .  mupirocin ointment (BACTROBAN) 2 %, APPLY TO AFFECTED AREAS TWICE A DAY AS NEEDED, Disp: 22 g, Rfl: 4 .  pantoprazole (PROTONIX) 40 MG tablet, TAKE ONE (1) TABLET BY MOUTH EVERY DAY, Disp: 30 tablet, Rfl: 12 .  rOPINIRole (REQUIP) 1 MG tablet, TAKE ONE TABLET DAILY AT BEDTIME, Disp: 30 tablet, Rfl: 12 .  ROZEREM 8 MG tablet, TAKE ONE TABLET BY MOUTH EVERY NIGHT AT BEDTIME, Disp: 30 tablet, Rfl: 12 .  silver sulfADIAZINE (SILVADENE) 1 % cream, Apply 1 application topically 2 (two) times daily., Disp: , Rfl:  .  tamsulosin (FLOMAX) 0.4 MG CAPS capsule, TAKE ONE CAPSULE BY MOUTH DAILY, Disp: 30 capsule, Rfl: 5 .  tiZANidine (ZANAFLEX) 4 MG tablet, Take 1 tablet by mouth every 6 (six) hours as needed., Disp: , Rfl:  .  traZODone (DESYREL) 100 MG tablet, Take 1 tablet (100 mg total) by mouth 3 (three) times daily., Disp: 90 tablet, Rfl: 6 .  triamcinolone cream (KENALOG) 0.1 %, APPLY TO AFFECTED AREAS TWICE DAILY AS NEEDED, Disp: 120 g, Rfl: 1 .  venlafaxine XR (EFFEXOR-XR) 75 MG 24 hr capsule, TAKE THREE CAPSULES BY MOUTH DAILY AS DIRECTED, Disp: 90 capsule, Rfl: 4 .  XARELTO 15 MG TABS  tablet, TAKE ONE (1) TABLET EACH DAY, Disp: 30 tablet, Rfl: 12  Review of Systems  Constitutional: Negative for appetite change, chills and fever.  Respiratory: Negative for chest tightness, shortness of breath and wheezing.   Cardiovascular: Positive for leg swelling. Negative for chest pain and palpitations.  Gastrointestinal: Negative for abdominal pain, nausea and vomiting.  Musculoskeletal: Positive for arthralgias and back pain.    Social History  Substance Use Topics  . Smoking status: Current Every Day Smoker    Packs/day: 0.02    Types: Cigarettes  . Smokeless tobacco: Never Used     Comment: 2-3 cigarettes daily  . Alcohol use No   Objective:   BP (!) 100/58 (BP Location: Left Arm, Patient Position: Sitting, Cuff Size: Large)   Pulse 94   Temp 98 F (  36.7 C) (Oral)   Resp 18   SpO2 98% Comment: room air Vitals:   08/14/16 1442  BP: (!) 100/58  Pulse: 94  Resp: 18  Temp: 98 F (36.7 C)  TempSrc: Oral  SpO2: 98%     Physical Exam   General Appearance:    Alert, cooperative, no distress  Eyes:    PERRL, conjunctiva/corneas clear, EOM's intact       Lungs:     Clear to auscultation bilaterally, respirations unlabored  Heart:    Regular rate and rhythm  Neurologic:   Awake, alert, oriented x 3. No apparent focal neurological           defect.          . Assessment & Plan:     1. Atrial fibrillation, unspecified type ([place]) He would like to try change to Eliquis to see if has less trouble with nose bleeds and  - apixaban (ELIQUIS) 2.5 MG TABS tablet; Take 1 tablet (2.5 mg total) by mouth 2 (two) times daily.  Dispense: 60 tablet; Refill: [place], MD  [facility]

## 2016-08-15 ENCOUNTER — Other Ambulatory Visit: Payer: Self-pay | Admitting: Family Medicine

## 2016-08-15 DIAGNOSIS — F411 Generalized anxiety disorder: Secondary | ICD-10-CM

## 2016-08-16 NOTE — Telephone Encounter (Signed)
rx called in-aa 

## 2016-08-19 ENCOUNTER — Other Ambulatory Visit: Payer: Self-pay | Admitting: Family Medicine

## 2016-08-19 DIAGNOSIS — M79606 Pain in leg, unspecified: Secondary | ICD-10-CM

## 2016-08-19 DIAGNOSIS — Z8781 Personal history of (healed) traumatic fracture: Secondary | ICD-10-CM

## 2016-08-20 DIAGNOSIS — I8310 Varicose veins of unspecified lower extremity with inflammation: Secondary | ICD-10-CM | POA: Diagnosis not present

## 2016-08-20 DIAGNOSIS — L821 Other seborrheic keratosis: Secondary | ICD-10-CM | POA: Diagnosis not present

## 2016-08-20 DIAGNOSIS — L578 Other skin changes due to chronic exposure to nonionizing radiation: Secondary | ICD-10-CM | POA: Diagnosis not present

## 2016-08-20 DIAGNOSIS — L570 Actinic keratosis: Secondary | ICD-10-CM | POA: Diagnosis not present

## 2016-08-22 MED ORDER — FENTANYL 75 MCG/HR TD PT72: 75.0000 ug | MEDICATED_PATCH | TRANSDERMAL | 0 refills | Status: DC

## 2016-08-22 MED ORDER — HYDROCODONE-ACETAMINOPHEN 5-325 MG PO TABS: ORAL_TABLET | ORAL | 0 refills | Status: DC

## 2016-08-22 NOTE — Telephone Encounter (Signed)
Pt contacted office for refill request on the following medications: 1. fentaNYL (DURAGESIC - DOSED MCG/HR) 75 MCG/HR  2. HYDROcodone-acetaminophen (NORCO/VICODIN) 5-325 MG tablet  Last Rx: 07/31/16 LOV: 08/14/16 Mail to [facility]. Please advise. Thanks TNP

## 2016-08-23 ENCOUNTER — Other Ambulatory Visit: Payer: Self-pay | Admitting: Family Medicine

## 2016-08-29 DIAGNOSIS — Z961 Presence of intraocular lens: Secondary | ICD-10-CM | POA: Diagnosis not present

## 2016-08-29 DIAGNOSIS — H401131 Primary open-angle glaucoma, bilateral, mild stage: Secondary | ICD-10-CM | POA: Diagnosis not present

## 2016-08-30 ENCOUNTER — Other Ambulatory Visit: Payer: Self-pay | Admitting: Family Medicine

## 2016-09-13 ENCOUNTER — Other Ambulatory Visit: Payer: Self-pay | Admitting: Family Medicine

## 2016-09-13 DIAGNOSIS — F411 Generalized anxiety disorder: Secondary | ICD-10-CM

## 2016-09-15 NOTE — Telephone Encounter (Signed)
Please call in alprazolam.  

## 2016-09-16 NOTE — Telephone Encounter (Signed)
Called in medication into the pharmacy.

## 2016-09-19 ENCOUNTER — Other Ambulatory Visit: Payer: Self-pay | Admitting: Family Medicine

## 2016-09-19 DIAGNOSIS — M79606 Pain in leg, unspecified: Secondary | ICD-10-CM

## 2016-09-19 DIAGNOSIS — Z8781 Personal history of (healed) traumatic fracture: Secondary | ICD-10-CM

## 2016-09-19 NOTE — Telephone Encounter (Signed)
Pt contacted office for refill request on the following medications:  fentaNYL (DURAGESIC - DOSED MCG/HR) 75 MCG/  HYDROcodone-acetaminophen (NORCO/VICODIN) 5-325 MG tablet   Pt request this mail to [facility] at [facility].  CB#[phone]/MW

## 2016-09-20 MED ORDER — FENTANYL 75 MCG/HR TD PT72: 75.0000 ug | MEDICATED_PATCH | TRANSDERMAL | 0 refills | Status: DC

## 2016-09-20 MED ORDER — HYDROCODONE-ACETAMINOPHEN 5-325 MG PO TABS: ORAL_TABLET | ORAL | 0 refills | Status: DC

## 2016-09-27 ENCOUNTER — Other Ambulatory Visit: Payer: Self-pay | Admitting: Family Medicine

## 2016-09-27 DIAGNOSIS — R609 Edema, unspecified: Secondary | ICD-10-CM

## 2016-10-02 ENCOUNTER — Telehealth: Payer: Self-pay | Admitting: Family Medicine

## 2016-10-02 NOTE — Telephone Encounter (Signed)
Please advise 

## 2016-10-02 NOTE — Telephone Encounter (Signed)
Having some bleeding in his nose and when he coughs.  He thinks it is from the  Eliquis 2.5 mg.  He wants to know if he should cut it to one time a day.  His call [phone]  [facility]

## 2016-10-03 NOTE — Telephone Encounter (Signed)
Advised patient as below.  

## 2016-10-03 NOTE — Telephone Encounter (Signed)
Recommend he start applying Vaseline ointment to nasal septum twice a day. He needs to continue BID Eliquis since he has a-fib. If nose bleeds are severe then he should see ENT

## 2016-10-04 ENCOUNTER — Other Ambulatory Visit: Payer: Self-pay | Admitting: Family Medicine

## 2016-10-04 ENCOUNTER — Telehealth: Payer: Self-pay | Admitting: Family Medicine

## 2016-10-04 DIAGNOSIS — I4891 Unspecified atrial fibrillation: Secondary | ICD-10-CM

## 2016-10-04 NOTE — Telephone Encounter (Signed)
Called pt concerning nose bleeds and spitting up blood. Patient stated that nose bleeds and spitting up blood is intermittent. There have been occasions where nose bleeds have been heavy, the blood spit up is light. Patient believes this could be due to his blood thinner. Advised patient to schedule appt to discuss nose bleeds with provider. Patient scheduled appt for 09/08/2016 10:45 pm.

## 2016-10-04 NOTE — Telephone Encounter (Signed)
Clair Gulling with [facility] called saying David Mcmillan called him asking reducing his eliquis Rx because it is cause nose bleeds and spitting up some blood.  He told Mr. Piechota to call Dr. Caryn Section.  Jim at [facility] just wanted [place] to give Mr. Tullier a call.  Thanks [employer]

## 2016-10-09 ENCOUNTER — Telehealth: Payer: Self-pay | Admitting: Family Medicine

## 2016-10-09 ENCOUNTER — Ambulatory Visit: Payer: Self-pay | Admitting: Family Medicine

## 2016-10-09 NOTE — Telephone Encounter (Signed)
Patient advised.

## 2016-10-09 NOTE — Telephone Encounter (Signed)
Please review. Thanks!  

## 2016-10-09 NOTE — Telephone Encounter (Signed)
Pt stated he was sorry he had to call yesterday to cancel his appt for today but he didn't have anyone that could bring him to the office. Pt stated that he has been taking ELIQUIS 2.5 MG TABS tablet twice a day and hasn't had any bleeding since he called and scheduled the appt. Pt wanted to see if he should reschedule the appt to come see Dr. Caryn Section or if he was fine not having an OV since he hasn't been bleeding. Please advise. Thanks TNP

## 2016-10-09 NOTE — Telephone Encounter (Signed)
No need to reschedule if he is not having any bleeding.

## 2016-10-11 ENCOUNTER — Other Ambulatory Visit: Payer: Self-pay | Admitting: Family Medicine

## 2016-10-17 ENCOUNTER — Other Ambulatory Visit: Payer: Self-pay | Admitting: Family Medicine

## 2016-10-17 DIAGNOSIS — Z8781 Personal history of (healed) traumatic fracture: Secondary | ICD-10-CM

## 2016-10-17 DIAGNOSIS — M79606 Pain in leg, unspecified: Secondary | ICD-10-CM

## 2016-10-17 NOTE — Telephone Encounter (Signed)
Pt contacted office for refill request on the following medications:  fentaNYL (DURAGESIC - DOSED MCG/HR) 75 MCG/HR   HYDROcodone-acetaminophen (NORCO/VICODIN) 5-325 MG tablet   Pt request this mailed to [facility]/Attn Jim/MW

## 2016-10-21 ENCOUNTER — Other Ambulatory Visit: Payer: Self-pay | Admitting: Family Medicine

## 2016-10-21 DIAGNOSIS — F411 Generalized anxiety disorder: Secondary | ICD-10-CM

## 2016-10-21 MED ORDER — HYDROCODONE-ACETAMINOPHEN 5-325 MG PO TABS: ORAL_TABLET | ORAL | 0 refills | Status: DC

## 2016-10-21 MED ORDER — FENTANYL 75 MCG/HR TD PT72: 75.0000 ug | MEDICATED_PATCH | TRANSDERMAL | 0 refills | Status: DC

## 2016-10-21 NOTE — Telephone Encounter (Signed)
Rx called in to pharmacy. 

## 2016-10-21 NOTE — Telephone Encounter (Signed)
Please call in alprazolam.  

## 2016-10-21 NOTE — Telephone Encounter (Signed)
[facility] faxed a request for the following medication. Thanks CC  magic mouthwash SOLN  >Swish and spit 10ML by mouth three times daily as needed.

## 2016-10-22 MED ORDER — MAGIC MOUTHWASH: ORAL | 3 refills | Status: DC

## 2016-10-22 NOTE — Telephone Encounter (Signed)
Rx called in to pharmacy. 

## 2016-10-23 ENCOUNTER — Telehealth: Payer: Self-pay | Admitting: Family Medicine

## 2016-10-23 NOTE — Telephone Encounter (Signed)
Pt states he is having lower back pain for about 2 weeks.  Pt has been taking tylenol but it is not helping.  Pt is asking if he can take something else or get a Rx to help with this.  [facility].  CB#[phone]/MW

## 2016-10-24 ENCOUNTER — Other Ambulatory Visit: Payer: Self-pay | Admitting: Family Medicine

## 2016-10-24 MED ORDER — TIZANIDINE HCL 4 MG PO TABS: 4.0000 mg | ORAL_TABLET | Freq: Four times a day (QID) | ORAL | 1 refills | Status: AC | PRN

## 2016-10-24 NOTE — Telephone Encounter (Signed)
Patient advised.

## 2016-10-24 NOTE — Progress Notes (Signed)
Have sent prescription to pharmacy for tizanidine

## 2016-10-24 NOTE — Telephone Encounter (Signed)
Have sent order for tizanidine

## 2016-10-24 NOTE — Telephone Encounter (Signed)
Pt says he can not come in for a visit but wants something called in for his back pain.  His call back [phone]  Thanks teri

## 2016-10-24 NOTE — Telephone Encounter (Signed)
Please advise 

## 2016-10-29 DIAGNOSIS — S22080A Wedge compression fracture of T11-T12 vertebra, initial encounter for closed fracture: Secondary | ICD-10-CM | POA: Diagnosis not present

## 2016-10-29 DIAGNOSIS — M533 Sacrococcygeal disorders, not elsewhere classified: Secondary | ICD-10-CM | POA: Diagnosis not present

## 2016-11-19 ENCOUNTER — Other Ambulatory Visit: Payer: Self-pay | Admitting: Family Medicine

## 2016-11-19 DIAGNOSIS — M79606 Pain in leg, unspecified: Secondary | ICD-10-CM

## 2016-11-19 DIAGNOSIS — Z8781 Personal history of (healed) traumatic fracture: Secondary | ICD-10-CM

## 2016-11-19 MED ORDER — FENTANYL 75 MCG/HR TD PT72: 75.0000 ug | MEDICATED_PATCH | TRANSDERMAL | 0 refills | Status: DC

## 2016-11-19 MED ORDER — HYDROCODONE-ACETAMINOPHEN 5-325 MG PO TABS
ORAL_TABLET | ORAL | 0 refills | Status: DC
Start: 2016-11-19 — End: 2016-12-16

## 2016-11-19 NOTE — Telephone Encounter (Signed)
Pt contacted office for refill request on the following medications:  1. fentaNYL (DURAGESIC - DOSED MCG/HR) 75 MCG/HR  2. HYDROcodone-acetaminophen (NORCO/VICODIN) 5-325 MG tablet   Last Rx: 10/21/16 LOV: 08/14/16 Please advise. Thanks TNP

## 2016-11-29 ENCOUNTER — Other Ambulatory Visit: Payer: Self-pay | Admitting: Family Medicine

## 2016-11-29 DIAGNOSIS — F411 Generalized anxiety disorder: Secondary | ICD-10-CM

## 2016-12-06 ENCOUNTER — Other Ambulatory Visit: Payer: Self-pay | Admitting: Family Medicine

## 2016-12-16 ENCOUNTER — Other Ambulatory Visit: Payer: Self-pay | Admitting: Family Medicine

## 2016-12-16 DIAGNOSIS — M79606 Pain in leg, unspecified: Secondary | ICD-10-CM

## 2016-12-16 DIAGNOSIS — Z8781 Personal history of (healed) traumatic fracture: Secondary | ICD-10-CM

## 2016-12-16 NOTE — Telephone Encounter (Signed)
Pt contacted office for refill request on the following medications:  1. fentaNYL (DURAGESIC - DOSED MCG/HR) 75 MCG/HR   2. HYDROcodone-acetaminophen (NORCO/VICODIN) 5-325 MG tablet   Please advise. Thanks TNP

## 2016-12-17 MED ORDER — FENTANYL 75 MCG/HR TD PT72: 75.0000 ug | MEDICATED_PATCH | TRANSDERMAL | 0 refills | Status: DC

## 2016-12-17 MED ORDER — HYDROCODONE-ACETAMINOPHEN 5-325 MG PO TABS: ORAL_TABLET | ORAL | 0 refills | Status: DC

## 2017-01-02 DIAGNOSIS — H401131 Primary open-angle glaucoma, bilateral, mild stage: Secondary | ICD-10-CM | POA: Diagnosis not present

## 2017-01-02 DIAGNOSIS — S0502XA Injury of conjunctiva and corneal abrasion without foreign body, left eye, initial encounter: Secondary | ICD-10-CM | POA: Diagnosis not present

## 2017-01-02 DIAGNOSIS — H18422 Band keratopathy, left eye: Secondary | ICD-10-CM | POA: Diagnosis not present

## 2017-01-07 ENCOUNTER — Other Ambulatory Visit: Payer: Self-pay | Admitting: Family Medicine

## 2017-01-07 DIAGNOSIS — F411 Generalized anxiety disorder: Secondary | ICD-10-CM

## 2017-01-10 ENCOUNTER — Other Ambulatory Visit: Payer: Self-pay | Admitting: Family Medicine

## 2017-01-10 NOTE — Telephone Encounter (Signed)
Pharmacy requesting refills. Thanks!  

## 2017-01-14 ENCOUNTER — Ambulatory Visit: Payer: PPO | Admitting: Family Medicine

## 2017-01-15 ENCOUNTER — Other Ambulatory Visit: Payer: Self-pay | Admitting: Family Medicine

## 2017-01-15 DIAGNOSIS — M79606 Pain in leg, unspecified: Secondary | ICD-10-CM

## 2017-01-15 DIAGNOSIS — Z8781 Personal history of (healed) traumatic fracture: Secondary | ICD-10-CM

## 2017-01-15 MED ORDER — FENTANYL 75 MCG/HR TD PT72: 75.0000 ug | MEDICATED_PATCH | TRANSDERMAL | 0 refills | Status: DC

## 2017-01-15 MED ORDER — HYDROCODONE-ACETAMINOPHEN 5-325 MG PO TABS
ORAL_TABLET | ORAL | 0 refills | Status: DC
Start: 2017-01-15 — End: 2017-02-19

## 2017-01-15 NOTE — Telephone Encounter (Signed)
Pt contacted office for refill request on the following medications:  1. HYDROcodone-acetaminophen (NORCO/VICODIN) 5-325 MG tablet   2. fentaNYL (DURAGESIC - DOSED MCG/HR) 75 MCG/HR  [place]  Please advise. Thanks TNP

## 2017-01-15 NOTE — Telephone Encounter (Signed)
Last RF 12/17/16 and last OV 08/14/16

## 2017-01-22 ENCOUNTER — Ambulatory Visit: Payer: PPO | Admitting: Family Medicine

## 2017-01-22 DIAGNOSIS — H18422 Band keratopathy, left eye: Secondary | ICD-10-CM | POA: Diagnosis not present

## 2017-01-23 ENCOUNTER — Ambulatory Visit: Payer: PPO | Admitting: Family Medicine

## 2017-01-23 ENCOUNTER — Encounter: Payer: Self-pay | Admitting: Family Medicine

## 2017-01-23 VITALS — BP 124/80 | HR 72 | Temp 97.9°F | Resp 16 | Ht 60.0 in

## 2017-01-23 DIAGNOSIS — I4891 Unspecified atrial fibrillation: Secondary | ICD-10-CM | POA: Diagnosis not present

## 2017-01-23 DIAGNOSIS — R040 Epistaxis: Secondary | ICD-10-CM | POA: Diagnosis not present

## 2017-01-23 MED ORDER — MIRTAZAPINE 7.5 MG PO TABS: 7.5000 mg | ORAL_TABLET | Freq: Every day | ORAL | 2 refills | Status: DC

## 2017-01-23 MED ORDER — ASPIRIN EC 81 MG PO TBEC: 81.0000 mg | DELAYED_RELEASE_TABLET | Freq: Every day | ORAL | 0 refills | Status: DC

## 2017-01-23 NOTE — Progress Notes (Signed)
Patient: David Mcmillan Male    DOB: [birth year]   83 y.o.   MRN: [id number] Visit Date: 01/23/2017  Today's Provider: Lelon Huh, MD   No chief complaint on file.  Subjective:    Patient is here to discuss Eliquis. Patient states he is still having nose bleeds.   Have decreased to 2.5mg  per day, but still having 2-3 nose bleeds every week.      Allergies  Allergen Reactions  . Invanz  [Ertapenem]     Seizure     Current Outpatient Medications:  .  acetaminophen (TYLENOL) 325 MG tablet, Take 2 tablets by mouth every 6 (six) hours as needed. For pain, Disp: , Rfl:  .  ALPRAZolam (XANAX) 1 MG tablet, TAKE ONE TABLET 2-3 TIMES DAILY AND 1 AT BEDTIME., Disp: 28 tablet, Rfl: 4 .  clobetasol ointment (TEMOVATE) 0.05 %, , Disp: , Rfl:  .  clotrimazole-betamethasone (LOTRISONE) cream, Apply 1 application topically 2 (two) times daily., Disp: , Rfl:  .  docusate sodium (COLACE) 100 MG capsule, TAKE TWO CAPSULES BY MOUTH TWICE DAILY AS DIRECTED AS DIRECTED, Disp: 120 capsule, Rfl: 12 .  dorzolamide (TRUSOPT) 2 % ophthalmic solution, , Disp: , Rfl:  .  ELIQUIS 2.5 MG TABS tablet, TAKE ONE TABLET BY MOUTH TWICE DAILY, Disp: 60 tablet, Rfl: 5 .  fentaNYL (DURAGESIC - DOSED MCG/HR) 75 MCG/HR, Place 1 patch (75 mcg total) onto the skin every 3 (three) days., Disp: 10 patch, Rfl: 0 .  ferrous sulfate 325 (65 FE) MG tablet, Take 1 tablet (325 mg total) by mouth 2 (two) times daily with a meal., Disp: 60 tablet, Rfl: 12 .  finasteride (PROSCAR) 5 MG tablet, TAKE ONE (1) TABLET BY MOUTH EVERY DAY, Disp: 30 tablet, Rfl: 12 .  furosemide (LASIX) 20 MG tablet, TAKE ONE (1) TABLET BY MOUTH EVERY DAY FOR SWELLING, Disp: 30 tablet, Rfl: 5 .  HYDROcodone-acetaminophen (NORCO/VICODIN) 5-325 MG tablet, TAKE ONE (1) TABLET BY MOUTH TWO (2) TIMES DAILY, Disp: 60 tablet, Rfl: 0 .  ketoconazole (NIZORAL) 2 % cream, Apply 1 application topically daily., Disp: 45 g, Rfl: 1 .  latanoprost (XALATAN)  0.005 % ophthalmic solution, Apply to eye., Disp: , Rfl:  .  levothyroxine (SYNTHROID, LEVOTHROID) 88 MCG tablet, TAKE ONE (1) TABLET BY MOUTH EVERY DAY, Disp: 30 tablet, Rfl: 12 .  magic mouthwash SOLN, Swish and spit out 10 mL three times daily as needed, Disp: 300 mL, Rfl: 3 .  Melatonin 3 MG TABS, Take 1 tablet (3 mg total) by mouth at bedtime., Disp: 30 tablet, Rfl: 5 .  meloxicam (MOBIC) 15 MG tablet, Take 1 tablet by mouth daily., Disp: , Rfl:  .  metoprolol (LOPRESSOR) 50 MG tablet, TAKE ONE-HALF TABLET BY MOUTH TWICE DAILY AS DIRECTED, Disp: 30 tablet, Rfl: 12 .  mupirocin ointment (BACTROBAN) 2 %, APPLY TO AFFECTED AREAS TWICE A DAY AS NEEDED, Disp: 22 g, Rfl: 4 .  pantoprazole (PROTONIX) 40 MG tablet, TAKE ONE (1) TABLET EACH DAY, Disp: 30 tablet, Rfl: 12 .  rOPINIRole (REQUIP) 1 MG tablet, TAKE ONE TABLET DAILY AT BEDTIME, Disp: 30 tablet, Rfl: 12 .  ROZEREM 8 MG tablet, TAKE ONE TABLET BY MOUTH EVERY NIGHT AT BEDTIME, Disp: 30 tablet, Rfl: 12 .  silver sulfADIAZINE (SILVADENE) 1 % cream, Apply 1 application topically 2 (two) times daily., Disp: , Rfl:  .  tamsulosin (FLOMAX) 0.4 MG CAPS capsule, TAKE ONE CAPSULE BY MOUTH DAILY, Disp: 30 capsule,  Rfl: 5 .  tiZANidine (ZANAFLEX) 4 MG tablet, Take 1 tablet (4 mg total) by mouth every 6 (six) hours as needed., Disp: 30 tablet, Rfl: 1 .  traZODone (DESYREL) 100 MG tablet, Take 1 tablet (100 mg total) by mouth 3 (three) times daily., Disp: 90 tablet, Rfl: 6 .  triamcinolone cream (KENALOG) 0.1 %, APPLY TO AFFECTED AREAS TWICE DAILY AS NEEDED, Disp: 120 g, Rfl: 1 .  venlafaxine XR (EFFEXOR-XR) 75 MG 24 hr capsule, TAKE 3 CAPSULES BY MOUTH EVERY DAY AS DIRECTED, Disp: 90 capsule, Rfl: 4  Review of Systems  Constitutional: Negative for appetite change, chills and fever.  Respiratory: Negative for chest tightness, shortness of breath and wheezing.   Cardiovascular: Negative for chest pain and palpitations.  Gastrointestinal: Negative for  abdominal pain, nausea and vomiting.    Social History   Tobacco Use  . Smoking status: Current Every Day Smoker    Packs/day: 0.02    Types: Cigarettes  . Smokeless tobacco: Never Used  . Tobacco comment: 2-3 cigarettes daily  Substance Use Topics  . Alcohol use: No    Alcohol/week: 0.0 oz   Objective:   BP 124/80 (BP Location: Left Arm, Patient Position: Sitting, Cuff Size: Normal)   Pulse 72   Temp 97.9 F (36.6 C) (Oral)   Resp 16   Ht 5' (1.524 m)   BMI 27.97 kg/m  Vitals:   01/23/17 1159  BP: 124/80  Pulse: 72  Resp: 16  Temp: 97.9 F (36.6 C)  TempSrc: Oral  Height: 5' (1.524 m)     Physical Exam  General Appearance:    Alert, cooperative, no distress  HENT:   ENT exam normal, no neck nodes or sinus tenderness  CV:   Irregularly irregular rhythm. Normal rate        Assessment & Plan:     1. Epistaxis Will put eliquis on hold for now and start back on ECASA. Consider ENT referral if continues to have nosebleeds more than once a week.   2. Atrial fibrillation, unspecified type ([place]) Currently asymptomatic. Rate well controlled.        Lelon Huh, MD  [facility]

## 2017-02-08 ENCOUNTER — Other Ambulatory Visit: Payer: Self-pay | Admitting: Family Medicine

## 2017-02-08 DIAGNOSIS — N400 Enlarged prostate without lower urinary tract symptoms: Secondary | ICD-10-CM

## 2017-02-08 DIAGNOSIS — F411 Generalized anxiety disorder: Secondary | ICD-10-CM

## 2017-02-13 ENCOUNTER — Ambulatory Visit: Payer: Self-pay | Admitting: Family Medicine

## 2017-02-19 ENCOUNTER — Other Ambulatory Visit: Payer: Self-pay | Admitting: Family Medicine

## 2017-02-19 ENCOUNTER — Telehealth: Payer: Self-pay

## 2017-02-19 DIAGNOSIS — M79606 Pain in leg, unspecified: Secondary | ICD-10-CM

## 2017-02-19 DIAGNOSIS — Z8781 Personal history of (healed) traumatic fracture: Secondary | ICD-10-CM

## 2017-02-19 NOTE — Telephone Encounter (Signed)
David Mcmillan from [employer] called wanting to know if we received a PA form for the prescription Rozerem. She states they have tried faxing this form several times and have not heard anything back. She states this is time sensitive. I confirmed with Dr. Caryn Section the form was received today. He hasn't had a chance to fill the form out. [name] states that when the form is completed we could call  [phone] with ref # [id number] to do the PA over the phone. The questions that they ask will be the questions on the form. Doing the PA over the phone would get the PA done sooner.

## 2017-02-19 NOTE — Telephone Encounter (Signed)
Patient is requesting a refill on the following medications  fentaNYL (DURAGESIC - DOSED MCG/HR) 75 MCG/HR   HYDROcodone-acetaminophen (NORCO/VICODIN) 5-325 MG tablet  Patient would like these sent to [employer]

## 2017-02-19 NOTE — Telephone Encounter (Signed)
Please review. Thanks!  

## 2017-02-20 MED ORDER — HYDROCODONE-ACETAMINOPHEN 5-325 MG PO TABS
ORAL_TABLET | ORAL | 0 refills | Status: DC
Start: 2017-02-20 — End: 2017-03-12

## 2017-02-20 MED ORDER — FENTANYL 75 MCG/HR TD PT72: 75.0000 ug | MEDICATED_PATCH | TRANSDERMAL | 0 refills | Status: DC

## 2017-02-21 ENCOUNTER — Telehealth: Payer: Self-pay | Admitting: Family Medicine

## 2017-02-21 NOTE — Telephone Encounter (Signed)
Patient states that his son has been taken to [facility] and he doesn't think he is going to make it.  He states that this has his nerves all tore up.  He would like to know if you will send something in to [place] for his nerves.

## 2017-02-21 NOTE — Telephone Encounter (Signed)
He already has prescription for alprazolam which is as strong of a nerve pill as there is. He can take an extra tablet or two a day if he needs to.

## 2017-02-21 NOTE — Telephone Encounter (Signed)
Please review. Thanks!  

## 2017-02-24 ENCOUNTER — Telehealth: Payer: Self-pay | Admitting: Family Medicine

## 2017-02-24 NOTE — Telephone Encounter (Signed)
-----   Message from Ola Spurr sent at 02/21/2017 10:07 AM EST ----- Review incoming fax

## 2017-02-24 NOTE — Telephone Encounter (Signed)
Please advise pharmacy that Rozerem was approved.

## 2017-02-24 NOTE — Telephone Encounter (Signed)
Patient was advised.  

## 2017-02-25 ENCOUNTER — Telehealth: Payer: Self-pay | Admitting: Family Medicine

## 2017-02-25 NOTE — Telephone Encounter (Signed)
Please review

## 2017-02-25 NOTE — Telephone Encounter (Signed)
Returned call to [employer] at [facility]. Due to [facility] closing it's doors tomorrow, a new rx for Fentanyl patch will need to be resent to [facility] in [place].

## 2017-02-25 NOTE — Telephone Encounter (Signed)
Returned call to Clair Gulling, he was unavailable. Will try again later.

## 2017-02-25 NOTE — Telephone Encounter (Signed)
[facility] will be closing its doors TOMORROW.   Ashby Dawes is the head pharmacist at [facility] and he is moving to [facility] in [place].   They will be compounding medications there also. Clair Gulling wants to help David Mcmillan out and manage his medications from [facility].  Clair Gulling would like to talk to either a CMA or Dr. Caryn Section regarding this.     [phone]

## 2017-02-25 NOTE — Telephone Encounter (Signed)
Since [facility] is closing tomorrow, Ashby Dawes, Pharmacist is moving to [facility].   Mr. Macomber Fentanyl is on back order at [facility] so the rx needs to be sent to [facility] for Fentanyl.  Clair Gulling will be working at [employer] on Thursday and will get this to Mr. Cavanah.     Also needs to get Alprazolam sent to [facility] but put on hold till next week per Clair Gulling.

## 2017-02-26 MED ORDER — FENTANYL 75 MCG/HR TD PT72: 75.0000 ug | MEDICATED_PATCH | TRANSDERMAL | 0 refills | Status: DC

## 2017-03-03 ENCOUNTER — Other Ambulatory Visit: Payer: Self-pay | Admitting: Family Medicine

## 2017-03-04 ENCOUNTER — Encounter: Payer: Self-pay | Admitting: Family Medicine

## 2017-03-04 ENCOUNTER — Ambulatory Visit (INDEPENDENT_AMBULATORY_CARE_PROVIDER_SITE_OTHER): Payer: PPO | Admitting: Family Medicine

## 2017-03-04 VITALS — BP 122/62 | HR 89 | Temp 97.8°F | Resp 16

## 2017-03-04 DIAGNOSIS — I4891 Unspecified atrial fibrillation: Secondary | ICD-10-CM

## 2017-03-04 DIAGNOSIS — F32A Depression, unspecified: Secondary | ICD-10-CM

## 2017-03-04 DIAGNOSIS — R040 Epistaxis: Secondary | ICD-10-CM

## 2017-03-04 DIAGNOSIS — F329 Major depressive disorder, single episode, unspecified: Secondary | ICD-10-CM | POA: Diagnosis not present

## 2017-03-04 MED ORDER — MIRTAZAPINE 30 MG PO TABS: 30.0000 mg | ORAL_TABLET | Freq: Every day | ORAL | 5 refills | Status: AC

## 2017-03-04 NOTE — Progress Notes (Signed)
Patient: David Mcmillan Male    DOB: [birth year]   84 y.o.   MRN: [id number] Visit Date: 03/04/2017  Today's Provider: Lelon Huh, MD   Chief Complaint  Patient presents with  . Atrial Fibrillation  . Depression   Subjective:    HPI Atrial fibrillation- last office visit 01/23/17 stopped Eliqus due to Epistaxis. Started back on EC ASA. Has not had any nose bleeds since switching back to aspirin.    Depression- pt wants to discuss depression, he reports that his wife died about 3 months ago and his son recently found out he had brain cancer and will not be alive much longer. He would like stronger or higher dose of his anti-depressants.      Allergies  Allergen Reactions  . Invanz  [Ertapenem]     Seizure     Current Outpatient Medications:  .  acetaminophen (TYLENOL) 325 MG tablet, Take 2 tablets by mouth every 6 (six) hours as needed. For pain, Disp: , Rfl:  .  ALPRAZolam (XANAX) 1 MG tablet, TAKE 1 TABLET BY MOUTH 2-3 TIMES DAILY AND 1 AT BEDTIME., Disp: 28 tablet, Rfl: 3 .  ASPIRIN ADULT LOW STRENGTH 81 MG EC tablet, TAKE (1) TABLET BY MOUTH EVERY DAY, Disp: 60 tablet, Rfl: 5 .  clobetasol ointment (TEMOVATE) 0.05 %, , Disp: , Rfl:  .  clotrimazole-betamethasone (LOTRISONE) cream, Apply 1 application topically 2 (two) times daily., Disp: , Rfl:  .  docusate sodium (COLACE) 100 MG capsule, TAKE TWO CAPSULES BY MOUTH TWICE DAILY AS DIRECTED AS DIRECTED, Disp: 120 capsule, Rfl: 12 .  dorzolamide (TRUSOPT) 2 % ophthalmic solution, , Disp: , Rfl:  .  fentaNYL (DURAGESIC - DOSED MCG/HR) 75 MCG/HR, Place 1 patch (75 mcg total) onto the skin every 3 (three) days., Disp: 10 patch, Rfl: 0 .  ferrous sulfate 325 (65 FE) MG tablet, Take 1 tablet (325 mg total) by mouth 2 (two) times daily with a meal., Disp: 60 tablet, Rfl: 12 .  finasteride (PROSCAR) 5 MG tablet, TAKE ONE (1) TABLET BY MOUTH EVERY DAY, Disp: 30 tablet, Rfl: 12 .  furosemide (LASIX) 20 MG tablet, TAKE ONE (1)  TABLET BY MOUTH EVERY DAY FOR SWELLING, Disp: 30 tablet, Rfl: 5 .  HYDROcodone-acetaminophen (NORCO/VICODIN) 5-325 MG tablet, TAKE ONE (1) TABLET BY MOUTH TWO (2) TIMES DAILY, Disp: 60 tablet, Rfl: 0 .  ketoconazole (NIZORAL) 2 % cream, Apply 1 application topically daily., Disp: 45 g, Rfl: 1 .  latanoprost (XALATAN) 0.005 % ophthalmic solution, Apply to eye., Disp: , Rfl:  .  levothyroxine (SYNTHROID, LEVOTHROID) 88 MCG tablet, TAKE ONE (1) TABLET BY MOUTH EVERY DAY, Disp: 30 tablet, Rfl: 12 .  magic mouthwash SOLN, Swish and spit out 10 mL three times daily as needed, Disp: 300 mL, Rfl: 3 .  Melatonin 3 MG TABS, Take 1 tablet (3 mg total) by mouth at bedtime., Disp: 30 tablet, Rfl: 5 .  meloxicam (MOBIC) 15 MG tablet, Take 1 tablet by mouth daily., Disp: , Rfl:  .  metoprolol (LOPRESSOR) 50 MG tablet, TAKE ONE-HALF TABLET BY MOUTH TWICE DAILY AS DIRECTED, Disp: 30 tablet, Rfl: 12 .  mirtazapine (REMERON) 7.5 MG tablet, Take 1 tablet (7.5 mg total) by mouth at bedtime., Disp: 30 tablet, Rfl: 2 .  mupirocin ointment (BACTROBAN) 2 %, APPLY TO AFFECTED AREAS TWICE A DAY AS NEEDED, Disp: 22 g, Rfl: 4 .  pantoprazole (PROTONIX) 40 MG tablet, TAKE ONE (1) TABLET EACH DAY,  Disp: 30 tablet, Rfl: 12 .  rOPINIRole (REQUIP) 1 MG tablet, TAKE ONE TABLET DAILY AT BEDTIME, Disp: 30 tablet, Rfl: 12 .  ROZEREM 8 MG tablet, TAKE ONE TABLET BY MOUTH EVERY NIGHT AT BEDTIME, Disp: 30 tablet, Rfl: 12 .  silver sulfADIAZINE (SILVADENE) 1 % cream, Apply 1 application topically 2 (two) times daily., Disp: , Rfl:  .  tamsulosin (FLOMAX) 0.4 MG CAPS capsule, TAKE 1 CAPSULE BY MOUTH EVERY DAY, Disp: 30 capsule, Rfl: 5 .  tiZANidine (ZANAFLEX) 4 MG tablet, Take 1 tablet (4 mg total) by mouth every 6 (six) hours as needed., Disp: 30 tablet, Rfl: 1 .  traZODone (DESYREL) 100 MG tablet, Take 1 tablet (100 mg total) by mouth 3 (three) times daily., Disp: 90 tablet, Rfl: 6 .  triamcinolone cream (KENALOG) 0.1 %, APPLY TO AFFECTED  AREAS TWICE DAILY AS NEEDED, Disp: 120 g, Rfl: 1 .  venlafaxine XR (EFFEXOR-XR) 75 MG 24 hr capsule, TAKE 3 CAPSULES BY MOUTH EVERY DAY AS DIRECTED, Disp: 90 capsule, Rfl: 4  Review of Systems  Constitutional: Negative for appetite change, chills and fever.  Respiratory: Negative for chest tightness, shortness of breath and wheezing.   Cardiovascular: Negative for chest pain and palpitations.  Gastrointestinal: Negative for abdominal pain, nausea and vomiting.    Social History   Tobacco Use  . Smoking status: Current Every Day Smoker    Packs/day: 0.02    Types: Cigarettes  . Smokeless tobacco: Never Used  . Tobacco comment: 2-3 cigarettes daily  Substance Use Topics  . Alcohol use: No    Alcohol/week: 0.0 oz   Objective:   BP 122/62 (BP Location: Left Arm, Patient Position: Sitting, Cuff Size: Normal)   Pulse 89   Temp 97.8 F (36.6 C) (Oral)   Resp 16  Vitals:   03/04/17 1142  BP: 122/62  Pulse: 89  Resp: 16  Temp: 97.8 F (36.6 C)  TempSrc: Oral     Physical Exam   General Appearance:    Alert, cooperative, no distress  Eyes:    PERRL, conjunctiva/corneas clear, EOM's intact       Lungs:     Clear to auscultation bilaterally, respirations unlabored  Heart:   Irregularly irregular rhythm. Normal rate   Neurologic:   Awake, alert, oriented x 3. No apparent focal neurological           defect.           Assessment & Plan:      1. Epistaxis Resolved since stopping Eliquis  2. Atrial fibrillation, unspecified type [facility]) Detailed discussion regarding lower risk of stroke with use of NOAC versus ECASA, however he did not tolerate approved dose of Eliquis and continued to have persistent and bothersome nosebleeds when he was taking it. He is agreeable to continiue ECASA, but does not want to take Eliquis, warfarin, or other similar anti-coagulants. Will continue daily ECASA for the time being.   3. Depression, unspecified depression type Worsened with terminal  prognosis of his son. Will increase  mirtazapine (REMERON) 30 MG tablet; Take 1 tablet (30 mg total) by mouth at bedtime.  Dispense: 30 tablet; Refill: 5  Return in about 3 months (around 06/02/2017).       Lelon Huh, MD  [facility]

## 2017-03-06 ENCOUNTER — Other Ambulatory Visit: Payer: Self-pay | Admitting: Family Medicine

## 2017-03-06 DIAGNOSIS — F411 Generalized anxiety disorder: Secondary | ICD-10-CM

## 2017-03-10 ENCOUNTER — Telehealth: Payer: Self-pay | Admitting: Family Medicine

## 2017-03-10 NOTE — Telephone Encounter (Signed)
Last week you had changed his Remeron to 30 mg. And he said he woke up and didn't know where he was or what day it was.   Michela Pitcher it has made him "almost  Crazy".  Thinks it's too strong.   Please call pt. back

## 2017-03-12 ENCOUNTER — Other Ambulatory Visit: Payer: Self-pay | Admitting: Family Medicine

## 2017-03-12 DIAGNOSIS — Z8781 Personal history of (healed) traumatic fracture: Secondary | ICD-10-CM

## 2017-03-12 DIAGNOSIS — M79606 Pain in leg, unspecified: Secondary | ICD-10-CM

## 2017-03-12 NOTE — Telephone Encounter (Signed)
Patient was advised. Patient expressed understanding.  

## 2017-03-12 NOTE — Telephone Encounter (Signed)
He should but back to 1/2 tablet daily

## 2017-03-12 NOTE — Telephone Encounter (Signed)
Pt contacted office for refill request on the following medications:  1. fentaNYL (DURAGESIC - DOSED MCG/HR) 75 MCG/HR  Last Rx: 02/26/17 2. HYDROcodone-acetaminophen (NORCO/VICODIN) 5-325 MG tablet  Last Rx: 02/20/17  [facility]  Please advise. Thanks TNP

## 2017-03-12 NOTE — Telephone Encounter (Signed)
Neither of these is due to be filled until the end of next week.

## 2017-03-12 NOTE — Telephone Encounter (Signed)
Pt called back b/c he hasn't heard back about the reaction he had to taking mirtazapine (REMERON) 30 MG tablet after it was increased to 30 mg. Pt hasn't taken the medication since the weekend and is asking what he should do. Pt stated he needs something to help him but felt the 30 mg was too strong. Pt asked if he should try a half of tablet or if he should try another medication.   David Mcmillan  Please advise. Thanks TNP

## 2017-03-19 ENCOUNTER — Other Ambulatory Visit: Payer: Self-pay | Admitting: Family Medicine

## 2017-03-20 MED ORDER — HYDROCODONE-ACETAMINOPHEN 5-325 MG PO TABS: ORAL_TABLET | ORAL | 0 refills | Status: DC

## 2017-04-03 ENCOUNTER — Other Ambulatory Visit: Payer: Self-pay | Admitting: Family Medicine

## 2017-04-04 ENCOUNTER — Other Ambulatory Visit: Payer: Self-pay | Admitting: Family Medicine

## 2017-04-04 NOTE — Telephone Encounter (Signed)
Pharmacy requesting refills. Thanks!  

## 2017-04-17 ENCOUNTER — Other Ambulatory Visit: Payer: Self-pay | Admitting: Family Medicine

## 2017-04-17 DIAGNOSIS — M79606 Pain in leg, unspecified: Secondary | ICD-10-CM

## 2017-04-17 DIAGNOSIS — Z8781 Personal history of (healed) traumatic fracture: Secondary | ICD-10-CM

## 2017-04-17 NOTE — Telephone Encounter (Signed)
Pt contacted office for refill request on the following medications:  1. fentaNYL (DURAGESIC - DOSED MCG/HR) 75 MCG/HR  2. HYDROcodone-acetaminophen (NORCO/VICODIN) 5-325 MG tablet  [facility]  Last Rx: 03/20/17 LOV: 03/04/17 Please advise. Please advise. Thanks TNP

## 2017-04-18 ENCOUNTER — Other Ambulatory Visit: Payer: Self-pay | Admitting: Family Medicine

## 2017-04-18 DIAGNOSIS — F411 Generalized anxiety disorder: Secondary | ICD-10-CM

## 2017-04-18 MED ORDER — FENTANYL 75 MCG/HR TD PT72: MEDICATED_PATCH | TRANSDERMAL | 0 refills | Status: DC

## 2017-04-18 MED ORDER — HYDROCODONE-ACETAMINOPHEN 5-325 MG PO TABS: ORAL_TABLET | ORAL | 0 refills | Status: DC

## 2017-04-23 DIAGNOSIS — H538 Other visual disturbances: Secondary | ICD-10-CM | POA: Diagnosis not present

## 2017-05-19 ENCOUNTER — Other Ambulatory Visit: Payer: Self-pay | Admitting: Family Medicine

## 2017-05-19 DIAGNOSIS — F411 Generalized anxiety disorder: Secondary | ICD-10-CM

## 2017-05-19 DIAGNOSIS — M79606 Pain in leg, unspecified: Secondary | ICD-10-CM

## 2017-05-19 DIAGNOSIS — Z8781 Personal history of (healed) traumatic fracture: Secondary | ICD-10-CM

## 2017-05-19 MED ORDER — HYDROCODONE-ACETAMINOPHEN 5-325 MG PO TABS: ORAL_TABLET | ORAL | 0 refills | Status: AC

## 2017-05-19 MED ORDER — FENTANYL 75 MCG/HR TD PT72: MEDICATED_PATCH | TRANSDERMAL | 0 refills | Status: AC

## 2017-05-19 MED ORDER — ALPRAZOLAM 1 MG PO TABS: ORAL_TABLET | ORAL | 4 refills | Status: DC

## 2017-05-19 NOTE — Telephone Encounter (Signed)
Pt contacted office for refill request on the following medications:  1. HYDROcodone-acetaminophen (NORCO/VICODIN) 5-325 MG tablet  2. fentaNYL (DURAGESIC - DOSED MCG/HR) 75 MCG/HR  [facility]  3. Pt stated his son passed away 14-May-2022 May 27, 2017 and he is having a difficult time. Pt is requesting if there is something a little more than ALPRAZolam (XANAX) 1 MG tablet that he can take for about 7 days to help him get through this or if he can take more than his regular Xanax dose. Please advise. Thanks TNP

## 2017-05-23 ENCOUNTER — Ambulatory Visit: Payer: PPO | Admitting: Family Medicine

## 2017-05-23 ENCOUNTER — Encounter: Payer: Self-pay | Admitting: Family Medicine

## 2017-05-23 ENCOUNTER — Ambulatory Visit (INDEPENDENT_AMBULATORY_CARE_PROVIDER_SITE_OTHER): Payer: PPO | Admitting: Family Medicine

## 2017-05-23 VITALS — BP 124/70 | HR 111 | Temp 98.0°F | Resp 16

## 2017-05-23 DIAGNOSIS — F411 Generalized anxiety disorder: Secondary | ICD-10-CM | POA: Diagnosis not present

## 2017-05-23 DIAGNOSIS — F13230 Sedative, hypnotic or anxiolytic dependence with withdrawal, uncomplicated: Secondary | ICD-10-CM

## 2017-05-23 DIAGNOSIS — F13930 Sedative, hypnotic or anxiolytic use, unspecified with withdrawal, uncomplicated: Secondary | ICD-10-CM

## 2017-05-23 NOTE — Patient Instructions (Signed)
Benzodiazepine Withdrawal Benzodiazepines are prescription medicines that decrease the activity of (depress) the central nervous system and cause changes in certain brain chemicals (neurotransmitters). Withdrawal is a group of physical and mental symptoms that can happen when you suddenly stop taking a medicine. There are many types of benzodiazepines. Some benzodiazepines take effect quickly and stay in your system for a short amount of time (short-acting). Other benzodiazepines require more time to take effect and stay in your system for longer amounts of time (long-acting). The five most commonly prescribed benzodiazepines are:  Alprazolam.  Lorazepam.  Clonazepam.  Diazepam.  Temazepam.  What are the causes? When you take benzodiazepines, your brain needs more and more of the medicine over time in order to get the same effects from it. This increased need is called tolerance. As you develop a tolerance, your brain adapts to the effects of the benzodiazepine and relies on these effects. This is called dependency. Withdrawal happens when you suddenly stop taking your medicine. This does not give your brain enough time to adapt to not having the medicine. What increases the risk? This condition is more likely to develop in:  People who have taken benzodiazepines for more than 1-2 weeks.  People who have developed a tolerance for benzodiazepines.  People who have developed a dependence on benzodiazepines.  People who take high dosages of benzodiazepines.  People who take doses of benzodiazepines that are higher than prescribed.  People who take benzodiazepines without a prescription.  People who use benzodiazepines with other substances that depress the central nervous system, such as alcohol.  People who have a history of drug or alcohol abuse.  What are the signs or symptoms? Symptoms of this condition may include:  Difficulty  sleeping.  Anxiety.  Restlessness.  Irritability.  Muscle aches.  Involuntary shaking or trembling of a body part (tremor).  Confusion and poor concentration.  Vomiting.  Sweating.  Headaches.  Feeling or seeing things that are not there (hallucinations).  Seizures.  Symptoms of withdrawal from short-acting benzodiazepines may develop 1-2 days after you stop taking your medicine, and they may last for 2-4 weeks or longer. Symptoms of withdrawal from long-acting benzodiazepines may develop 2-7 days after you stop taking your medicine, and they may last for 2-8 weeks or longer. How is this diagnosed? This condition may be diagnosed based on:  Your symptoms.  A physical exam. Your health care provider may check for: ? Rapid heartbeat. ? Rapid breathing. ? Tremors. ? High blood pressure.  Blood tests.  Urine tests.  Your alcohol and drug habits.  Your medical history.  How is this treated? Treatment for this condition depends on:  Your symptoms.  The type of benzodiazepine you have been taking.  How long you have been taking benzodiazepines.  Treatment usually involves starting you on a safe and stable dose of a benzodiazepine and then slowly lowering your dosage over time (tapered withdrawal). This may be done at a hospital or a treatment center. Long-term treatment for this condition may involve medicine, counseling, and support groups. Follow these instructions at home:  Take over-the-counter and prescription medicines only as told by your health care provider.  Check with your health care provider before starting new medicines.  Keep all follow-up visits as told by your health care provider. This is important. How is this prevented?  Do not take any benzodiazepines without a prescription.  Do not take more than your prescribed dosage.  Do not mix benzodiazepines with alcohol or other medicines.  Do not stop taking benzodiazepines without speaking  with your health care provider. Contact a health care provider if:  You are not able to take your medicines as told by your health care provider.  You have symptoms that get worse.  You develop withdrawal symptoms during your tapered withdrawal.  You develop a craving for drugs or alcohol.  You experience withdrawal again (relapse). Get help right away if:  You have a seizure.  You become very confused.  You lose consciousness.  You have difficulty breathing.  You have serious thoughts about hurting yourself or someone else. This information is not intended to replace advice given to you by your health care provider. Make sure you discuss any questions you have with your health care provider. Document Released: 01/10/2011 Document Revised: 06/01/2015 Document Reviewed: 07/13/2014 Elsevier Interactive Patient Education  [employer].

## 2017-05-23 NOTE — Progress Notes (Signed)
Patient: David Mcmillan Male    DOB: [birth year]   84 y.o.   MRN: [id number] Visit Date: 05/23/2017  Today's Provider: Lavon Paganini, MD   I, Martha Clan, CMA, am acting as scribe for Lavon Paganini, MD.  Chief Complaint  Patient presents with  . Anxiety   Subjective:    HPI   Pt's son passed away from brain cancer on Saturday. He had to double his Alprazolam for the funeral service. He ran out of the medication early, and started feeling nauseous and sick.  He also felt shaky.  He did receive a refill of the Alprazolam from his PCP, and took 2 before bedtime last night. He woke up felling well, and is here to discuss increasing his Alprazolam.  He states that he regularly takes his Xanax 1 mg 4 times daily and he would like to take a higher dose 4 times daily.  He is also on Remeron, Effexor, trazodone, Zanaflex, Requip, Norco, fentanyl patch.  He requests a "tranquilizer" to help him cope with his loss feel less nervous.  He denies SI/HI.  He states that he currently feels "as nervous as a whore in church."He does not see a therapist, but reports that his caregiver that is with him today is someone that he can talk to.  He does not have any family, but she is like his family.  His wife also died about 3 years ago.  Allergies  Allergen Reactions  . Invanz  [Ertapenem]     Seizure     Current Outpatient Medications:  .  acetaminophen (TYLENOL) 325 MG tablet, Take 2 tablets by mouth every 6 (six) hours as needed. For pain, Disp: , Rfl:  .  ALPRAZolam (XANAX) 1 MG tablet, TAKE 1 TABLET BY MOUTH 2 TO 3 TIMES A DAY AND 1 TABLET AT BEDTIME, Disp: 28 tablet, Rfl: 4 .  ASPIRIN ADULT LOW STRENGTH 81 MG EC tablet, TAKE (1) TABLET BY MOUTH EVERY DAY, Disp: 60 tablet, Rfl: 5 .  docusate sodium (COLACE) 100 MG capsule, TAKE TWO CAPSULES BY MOUTH TWICE DAILY AS DIRECTED AS DIRECTED, Disp: 120 capsule, Rfl: 12 .  dorzolamide (TRUSOPT) 2 % ophthalmic solution, , Disp: , Rfl:  .   fentaNYL (DURAGESIC - DOSED MCG/HR) 75 MCG/HR, PLACE 1 PATCH ONTO THE SKIN EVERY 3 DAYS, Disp: 10 patch, Rfl: 0 .  ferrous sulfate 325 (65 FE) MG tablet, Take 1 tablet (325 mg total) by mouth 2 (two) times daily with a meal., Disp: 60 tablet, Rfl: 12 .  finasteride (PROSCAR) 5 MG tablet, TAKE ONE (1) TABLET BY MOUTH EVERY DAY, Disp: 30 tablet, Rfl: 12 .  furosemide (LASIX) 20 MG tablet, TAKE ONE (1) TABLET BY MOUTH EVERY DAY FOR SWELLING, Disp: 30 tablet, Rfl: 5 .  HYDROcodone-acetaminophen (NORCO/VICODIN) 5-325 MG tablet, TAKE ONE (1) TABLET BY MOUTH TWO (2) TIMES DAILY, Disp: 60 tablet, Rfl: 0 .  latanoprost (XALATAN) 0.005 % ophthalmic solution, Apply to eye., Disp: , Rfl:  .  levothyroxine (SYNTHROID, LEVOTHROID) 88 MCG tablet, TAKE ONE (1) TABLET BY MOUTH EVERY DAY, Disp: 30 tablet, Rfl: 12 .  Melatonin 3 MG TABS, Take 1 tablet (3 mg total) by mouth at bedtime., Disp: 30 tablet, Rfl: 5 .  meloxicam (MOBIC) 15 MG tablet, Take 1 tablet by mouth daily., Disp: , Rfl:  .  metoprolol tartrate (LOPRESSOR) 50 MG tablet, TAKE (1/2) TABLET BY MOUTH TWICE DAILY AS DIRECTED, Disp: 30 tablet, Rfl: 12 .  mirtazapine (REMERON)  30 MG tablet, Take 1 tablet (30 mg total) by mouth at bedtime., Disp: 30 tablet, Rfl: 5 .  pantoprazole (PROTONIX) 40 MG tablet, TAKE ONE (1) TABLET EACH DAY, Disp: 30 tablet, Rfl: 12 .  rOPINIRole (REQUIP) 1 MG tablet, TAKE (1) TABLET BY MOUTH EVERY DAY, Disp: 30 tablet, Rfl: 12 .  ROZEREM 8 MG tablet, TAKE ONE TABLET BY MOUTH EVERY NIGHT AT BEDTIME, Disp: 30 tablet, Rfl: 12 .  tamsulosin (FLOMAX) 0.4 MG CAPS capsule, TAKE 1 CAPSULE BY MOUTH EVERY DAY, Disp: 30 capsule, Rfl: 5 .  tiZANidine (ZANAFLEX) 4 MG tablet, Take 1 tablet (4 mg total) by mouth every 6 (six) hours as needed., Disp: 30 tablet, Rfl: 1 .  traZODone (DESYREL) 100 MG tablet, Take 1 tablet (100 mg total) by mouth 3 (three) times daily., Disp: 90 tablet, Rfl: 6 .  venlafaxine XR (EFFEXOR-XR) 75 MG 24 hr capsule, TAKE (3)  CAPSULES BY MOUTH EVERY DAY AS DIRECTED AS DIRECTED, Disp: 90 capsule, Rfl: 4 .  clobetasol ointment (TEMOVATE) 0.05 %, , Disp: , Rfl:  .  clotrimazole-betamethasone (LOTRISONE) cream, Apply 1 application topically 2 (two) times daily., Disp: , Rfl:  .  ketoconazole (NIZORAL) 2 % cream, Apply 1 application topically daily. (Patient not taking: Reported on 05/23/2017), Disp: 45 g, Rfl: 1 .  mupirocin ointment (BACTROBAN) 2 %, APPLY TO AFFECTED AREAS TWICE A DAY AS NEEDED (Patient not taking: Reported on 05/23/2017), Disp: 22 g, Rfl: 4 .  silver sulfADIAZINE (SILVADENE) 1 % cream, Apply 1 application topically 2 (two) times daily., Disp: , Rfl:  .  triamcinolone cream (KENALOG) 0.1 %, APPLY TO AFFECTED AREAS TWICE DAILY AS NEEDED (Patient not taking: Reported on 05/23/2017), Disp: 120 g, Rfl: 1  Review of Systems  Constitutional: Positive for activity change, appetite change and fatigue. Negative for chills, diaphoresis and fever.  Respiratory: Negative for shortness of breath.   Gastrointestinal: Positive for nausea. Negative for vomiting.  Psychiatric/Behavioral: Positive for sleep disturbance. The patient is nervous/anxious.     Social History   Tobacco Use  . Smoking status: Current Every Day Smoker    Packs/day: 0.02    Types: Cigarettes  . Smokeless tobacco: Never Used  . Tobacco comment: 2-3 cigarettes daily  Substance Use Topics  . Alcohol use: No    Alcohol/week: 0.0 oz   Objective:   BP 124/70 (BP Location: Left Arm, Patient Position: Sitting, Cuff Size: Normal)   Pulse (!) 111   Temp 98 F (36.7 C) (Oral)   Resp 16   SpO2 96%  Vitals:   05/23/17 1035  BP: 124/70  Pulse: (!) 111  Resp: 16  Temp: 98 F (36.7 C)  TempSrc: Oral  SpO2: 96%     Physical Exam  Constitutional: He is oriented to person, place, and time. No distress.  Sitting in a wheelchair hunched over.  Intermittently dozing off  HENT:  Head: Normocephalic and atraumatic.  Cardiovascular: Normal rate  and regular rhythm.  No murmur heard. Pulmonary/Chest: Effort normal and breath sounds normal. No respiratory distress. He has no wheezes. He has no rales.  Musculoskeletal: He exhibits no edema.  Neurological: He is alert and oriented to person, place, and time.  Skin: Skin is warm and dry. Capillary refill takes less than 2 seconds. No rash noted.  Psychiatric:  Anxious with depressed mood.  Normal speech.  Alert, though drowsy, and oriented.  Vitals reviewed.       Assessment & Plan:  1. Generalized anxiety disorder -Patient  is currently being managed for GAD by his PCP with Effexor, Remeron, and Xanax 1 mg up to 4 times daily -Patient's anxiety has been exacerbated by the recent death of his son  -since that time, he has been taking more Xanax than prescribed as a coping mechanism -Long discussion with the patient regarding the dangers of overdose with benzos, especially at his age with his polypharmacy -Patient appears drowsy and occasionally dozes off during our discussion today, which is likely related to him taking 2 mg of Xanax prior to the appointment -discussed with him and his caregiver to 4 times a day that it is prescribed - He has upcoming follow-up with his primary care doctor to discuss ongoing management -Patient may also benefit from grief counseling  2. Benzodiazepine withdrawal without complication (HCC) -Patient symptoms after he ran out of his Xanax seem consistent with benzo withdrawal -he is doing better now that he is back on his Xanax, though he is drowsy and slightly tachycardic on presentation today - As above, long discussion with the patient and his caregiver regarding dangers of both overdose and withdrawal from benzos -he needs to take his Xanax only as prescribed to avoid either of these situations -Ongoing follow-up and management anxiety disorder with his PCP -had to reiterate multiple times that I will not increase his Xanax prescription  today   Return if symptoms worsen or fail to improve.   The entirety of the information documented in the History of Present Illness, Review of Systems and Physical Exam were personally obtained by me. Portions of this information were initially documented by Raquel Sarna Ratchford, CMA and reviewed by me for thoroughness and accuracy.    Virginia Crews, MD, MPH [facility] 05/23/2017 2:37 PM

## 2017-05-27 DIAGNOSIS — Z961 Presence of intraocular lens: Secondary | ICD-10-CM | POA: Diagnosis not present

## 2017-05-27 DIAGNOSIS — H401131 Primary open-angle glaucoma, bilateral, mild stage: Secondary | ICD-10-CM | POA: Diagnosis not present

## 2017-05-30 ENCOUNTER — Telehealth: Payer: Self-pay | Admitting: Family Medicine

## 2017-05-30 NOTE — Telephone Encounter (Signed)
Pt states his son passed away a week ago.  States since then he has not been sleeping well and his vision has become blurred.  States he went to [facility] twice this past week about it and they said he would need to "rest" his eye and suggested me contact his PCP to have a sleep aid sent in for him so his eye can naturally lubricate itself.    He is requesting something be sent in today to [facility].

## 2017-05-30 NOTE — Telephone Encounter (Signed)
Please review

## 2017-05-30 NOTE — Telephone Encounter (Signed)
Denise advised. She states pt is not taking Alprazolam. I advised her to give this rx at bedtime, as it will help him sleep. Also advised her to try Melatonin. Pt's grief is worsening, and Langley Gauss states his right eye is so dry that it "looks like asphalt". I advised her that Dr. Caryn Section will be in office next week, and to make OV with him.

## 2017-05-30 NOTE — Telephone Encounter (Signed)
David Mcmillan, [name] caregiver, called stating he is desperate to be able to sleep.  His eye will not heal if he doesn't sleep.

## 2017-05-30 NOTE — Telephone Encounter (Signed)
With the high doses of Xanax that he is taking, it is not safe to add a sleep aide.  His xanax itself is a sleep aide.  As we discussed during recent OV though, he should not be taking this more than prescribed.  Virginia Crews, MD, MPH [facility] 05/30/2017 11:20 AM

## 2017-06-02 ENCOUNTER — Telehealth: Payer: Self-pay | Admitting: Family Medicine

## 2017-06-02 NOTE — Telephone Encounter (Signed)
Patient was advised.  

## 2017-06-02 NOTE — Telephone Encounter (Signed)
He is already taking several sleep aids and sedatives. There is nothing else that is safe for him to add what he is already taking.

## 2017-06-02 NOTE — Telephone Encounter (Signed)
Pt stated he saw his eye doctor at [facility] and was advised that pt is having difficulty with dry eye and other eye issues is because pt hasn't been sleeping much since the death of his son. Pt stated provider at [facility] advised for pt to contact PCP to see about getting an Rx for something that will help pt sleep so that pt's eye will stay closed while he sleeps and that will give pt's eye time to self lubricate. [facility]. Please advise. Thanks TNP

## 2017-06-02 NOTE — Telephone Encounter (Signed)
Please advise 

## 2017-06-03 ENCOUNTER — Ambulatory Visit: Payer: Self-pay | Admitting: Family Medicine

## 2017-06-04 ENCOUNTER — Other Ambulatory Visit: Payer: Self-pay | Admitting: Family Medicine

## 2017-06-10 ENCOUNTER — Ambulatory Visit (INDEPENDENT_AMBULATORY_CARE_PROVIDER_SITE_OTHER): Payer: PPO | Admitting: Family Medicine

## 2017-06-10 ENCOUNTER — Encounter: Payer: Self-pay | Admitting: Family Medicine

## 2017-06-10 VITALS — BP 130/80 | HR 106 | Temp 97.5°F | Resp 18

## 2017-06-10 DIAGNOSIS — G4700 Insomnia, unspecified: Secondary | ICD-10-CM | POA: Diagnosis not present

## 2017-06-10 DIAGNOSIS — F411 Generalized anxiety disorder: Secondary | ICD-10-CM | POA: Diagnosis not present

## 2017-06-10 DIAGNOSIS — F329 Major depressive disorder, single episode, unspecified: Secondary | ICD-10-CM | POA: Diagnosis not present

## 2017-06-10 DIAGNOSIS — E890 Postprocedural hypothyroidism: Secondary | ICD-10-CM | POA: Diagnosis not present

## 2017-06-10 DIAGNOSIS — R5383 Other fatigue: Secondary | ICD-10-CM | POA: Diagnosis not present

## 2017-06-10 DIAGNOSIS — F32A Depression, unspecified: Secondary | ICD-10-CM

## 2017-06-10 MED ORDER — LORAZEPAM 1 MG PO TABS: 1.0000 mg | ORAL_TABLET | Freq: Every day | ORAL | 3 refills | Status: DC

## 2017-06-10 MED ORDER — ALPRAZOLAM 1 MG PO TABS: 1.0000 mg | ORAL_TABLET | Freq: Three times a day (TID) | ORAL | 3 refills | Status: AC | PRN

## 2017-06-10 MED ORDER — LORAZEPAM 1 MG PO TABS: 1.0000 mg | ORAL_TABLET | Freq: Every day | ORAL | 3 refills | Status: AC

## 2017-06-10 NOTE — Progress Notes (Signed)
Patient: David Mcmillan Male    DOB: 1932/03/06   84 y.o.   MRN: [id number] Visit Date: 06/10/2017  Today's Provider: Lelon Huh, MD   Chief Complaint  Patient presents with  . Fatigue    x 1 week   Subjective:    HPI Fatigue: Patient comes in stating that for the past week he hasn't had any energy. He says he  started feeling this way soon after his son died. Associated symptoms includes trouble sleeping, nausea, decreased mobility and decreased appetite. He states he has been feeling much worse since his son died 10 days ago. He cannot sleep at night, but feels extremely sleepy during the day. He wants something to help him sleep, but is already taking alprazolam, roserem, and mirtazapine at bedtime. He also take alprazolam three times during the day and trazodone throughout the day.He continues on 225mg  venlafaxine every day for anxiety and depression.   He is noted to be due for routine labs including thyroid functions which have not been checked since last year.     Allergies  Allergen Reactions  . Invanz  [Ertapenem]     Seizure     Current Outpatient Medications:  .  acetaminophen (TYLENOL) 325 MG tablet, Take 2 tablets by mouth every 6 (six) hours as needed. For pain, Disp: , Rfl:  .  ALPRAZolam (XANAX) 1 MG tablet, TAKE 1 TABLET BY MOUTH 2 TO 3 TIMES A DAY AND 1 TABLET AT BEDTIME, Disp: 28 tablet, Rfl: 4 .  ASPIRIN ADULT LOW STRENGTH 81 MG EC tablet, TAKE (1) TABLET BY MOUTH EVERY DAY, Disp: 60 tablet, Rfl: 5 .  brimonidine (ALPHAGAN) 0.2 % ophthalmic solution, 1 drop in each eye 2 times daily, Disp: , Rfl:  .  clobetasol ointment (TEMOVATE) 0.05 %, , Disp: , Rfl:  .  clotrimazole-betamethasone (LOTRISONE) cream, Apply 1 application topically 2 (two) times daily., Disp: , Rfl:  .  docusate sodium (COLACE) 100 MG capsule, TAKE TWO CAPSULES BY MOUTH TWICE DAILY AS DIRECTED AS DIRECTED, Disp: 120 capsule, Rfl: 12 .  dorzolamide (TRUSOPT) 2 % ophthalmic solution, ,  Disp: , Rfl:  .  erythromycin ophthalmic ointment, Place in left eye 4 times daily, Disp: , Rfl:  .  fentaNYL (DURAGESIC - DOSED MCG/HR) 75 MCG/HR, PLACE 1 PATCH ONTO THE SKIN EVERY 3 DAYS, Disp: 10 patch, Rfl: 0 .  ferrous sulfate 325 (65 FE) MG tablet, Take 1 tablet (325 mg total) by mouth 2 (two) times daily with a meal., Disp: 60 tablet, Rfl: 12 .  finasteride (PROSCAR) 5 MG tablet, TAKE ONE (1) TABLET BY MOUTH EVERY DAY, Disp: 30 tablet, Rfl: 12 .  furosemide (LASIX) 20 MG tablet, TAKE ONE (1) TABLET BY MOUTH EVERY DAY FOR SWELLING, Disp: 30 tablet, Rfl: 5 .  HYDROcodone-acetaminophen (NORCO/VICODIN) 5-325 MG tablet, TAKE ONE (1) TABLET BY MOUTH TWO (2) TIMES DAILY, Disp: 60 tablet, Rfl: 0 .  ketoconazole (NIZORAL) 2 % cream, Apply 1 application topically daily., Disp: 45 g, Rfl: 1 .  latanoprost (XALATAN) 0.005 % ophthalmic solution, Apply to eye., Disp: , Rfl:  .  levothyroxine (SYNTHROID, LEVOTHROID) 88 MCG tablet, TAKE ONE (1) TABLET BY MOUTH EVERY DAY, Disp: 30 tablet, Rfl: 12 .  Melatonin 3 MG TABS, Take 1 tablet (3 mg total) by mouth at bedtime., Disp: 30 tablet, Rfl: 5 .  meloxicam (MOBIC) 15 MG tablet, Take 1 tablet by mouth daily., Disp: , Rfl:  .  metoprolol tartrate (LOPRESSOR)  50 MG tablet, TAKE (1/2) TABLET BY MOUTH TWICE DAILY AS DIRECTED, Disp: 30 tablet, Rfl: 12 .  mirtazapine (REMERON) 30 MG tablet, Take 1 tablet (30 mg total) by mouth at bedtime., Disp: 30 tablet, Rfl: 5 .  mupirocin ointment (BACTROBAN) 2 %, APPLY TO AFFECTED AREAS TWICE A DAY AS NEEDED, Disp: 22 g, Rfl: 4 .  pantoprazole (PROTONIX) 40 MG tablet, TAKE ONE (1) TABLET EACH DAY, Disp: 30 tablet, Rfl: 12 .  Polyethyl Glyc-Propyl Glyc PF (SYSTANE PRESERVATIVE FREE) 0.4-0.3 % SOLN, Apply 1 drop to eye 3 (three) times daily as needed. For dry eyes, Disp: , Rfl:  .  rOPINIRole (REQUIP) 1 MG tablet, TAKE (1) TABLET BY MOUTH EVERY DAY, Disp: 30 tablet, Rfl: 12 .  ROZEREM 8 MG tablet, TAKE ONE TABLET BY MOUTH EVERY  NIGHT AT BEDTIME, Disp: 30 tablet, Rfl: 12 .  silver sulfADIAZINE (SILVADENE) 1 % cream, Apply 1 application topically 2 (two) times daily., Disp: , Rfl:  .  tamsulosin (FLOMAX) 0.4 MG CAPS capsule, TAKE 1 CAPSULE BY MOUTH EVERY DAY, Disp: 30 capsule, Rfl: 5 .  tiZANidine (ZANAFLEX) 4 MG tablet, Take 1 tablet (4 mg total) by mouth every 6 (six) hours as needed., Disp: 30 tablet, Rfl: 1 .  traZODone (DESYREL) 100 MG tablet, TAKE (1) TABLET BY MOUTH THREE TIMES A DAY, Disp: 90 tablet, Rfl: 11 .  triamcinolone cream (KENALOG) 0.1 %, APPLY TO AFFECTED AREAS TWICE DAILY AS NEEDED, Disp: 120 g, Rfl: 1 .  venlafaxine XR (EFFEXOR-XR) 75 MG 24 hr capsule, TAKE (3) CAPSULES BY MOUTH EVERY DAY AS DIRECTED AS DIRECTED, Disp: 90 capsule, Rfl: 4  Review of Systems  Constitutional: Positive for appetite change and fatigue. Negative for chills and fever.  Respiratory: Negative for chest tightness, shortness of breath and wheezing.   Cardiovascular: Negative for chest pain and palpitations.  Gastrointestinal: Negative for abdominal pain, nausea and vomiting.  Neurological: Positive for weakness.  Psychiatric/Behavioral: Positive for dysphoric mood and sleep disturbance.    Social History   Tobacco Use  . Smoking status: Current Every Day Smoker    Packs/day: 0.02    Types: Cigarettes  . Smokeless tobacco: Never Used  . Tobacco comment: 2-3 cigarettes daily  Substance Use Topics  . Alcohol use: No    Alcohol/week: 0.0 oz   Objective:   BP 130/80 (BP Location: Left Arm, Patient Position: Sitting, Cuff Size: Normal)   Pulse (!) 106   Temp (!) 97.5 F (36.4 C) (Oral)   Resp 18   SpO2 99% Comment: room air    Physical Exam   General Appearance:    Alert, cooperative, no distress  Eyes:    PERRL, conjunctiva/corneas clear, EOM's intact       Lungs:     Clear to auscultation bilaterally, respirations unlabored  Heart:     Irregularly irregular rhythm. Normal rate   Neurologic:   Awake, alert,  oriented x 3. No apparent focal neurological           defect.           Assessment & Plan:     1. Generalized anxiety disorder Will reduce alprazolam to daytime only  allow him to take LORAzepam at bedtime as below.  - ALPRAZolam (XANAX) 1 MG tablet; Take 1 tablet (1 mg total) by mouth 3 (three) times daily as needed for anxiety.  Dispense: 21 tablet; Refill: 3  2. Hypothyroidism, postop  - TSH  3. Other fatigue  - Comprehensive metabolic panel -  CBC - TSH - T4, free  4. Insomnia, unspecified type Secondary to stress and depression. Is already on ropinarole, mirtazapine, trazodone, and alprazolam.  Will d/c trazodone and replace bedtime alprazolam with lorazapam - LORazepam (ATIVAN) 1 MG tablet; Take 1 tablet (1 mg total) by mouth at bedtime.  Dispense: 7 tablet; Refill: 3  5. Depression, unspecified depression type Continue venlafaxine 225mg  a day.        Lelon Huh, MD  [facility]

## 2017-06-11 ENCOUNTER — Telehealth: Payer: Self-pay | Admitting: *Deleted

## 2017-06-11 ENCOUNTER — Telehealth: Payer: Self-pay | Admitting: Emergency Medicine

## 2017-06-11 LAB — COMPREHENSIVE METABOLIC PANEL
ALK PHOS: 103 IU/L (ref 39–117)
ALT: 17 IU/L (ref 0–44)
AST: 22 IU/L (ref 0–40)
Albumin/Globulin Ratio: 0.9 — ABNORMAL LOW (ref 1.2–2.2)
Albumin: 3.4 g/dL — ABNORMAL LOW (ref 3.5–4.7)
BILIRUBIN TOTAL: 0.4 mg/dL (ref 0.0–1.2)
BUN/Creatinine Ratio: 41 — ABNORMAL HIGH (ref 10–24)
BUN: 21 mg/dL (ref 8–27)
CHLORIDE: 91 mmol/L — AB (ref 96–106)
CO2: 28 mmol/L (ref 20–29)
Calcium: 8.6 mg/dL (ref 8.6–10.2)
Creatinine, Ser: 0.51 mg/dL — ABNORMAL LOW (ref 0.76–1.27)
GFR calc Af Amer: 115 mL/min/{1.73_m2} (ref >59)
GFR calc non Af Amer: 99 mL/min/{1.73_m2} (ref >59)
GLUCOSE: 102 mg/dL — AB (ref 65–99)
Globulin, Total: 3.7 g/dL (ref 1.5–4.5)
POTASSIUM: 4.2 mmol/L (ref 3.5–5.2)
Sodium: 134 mmol/L (ref 134–144)
TOTAL PROTEIN: 7.1 g/dL (ref 6.0–8.5)

## 2017-06-11 LAB — CBC
HEMATOCRIT: 33.8 % — AB (ref 37.5–51.0)
HEMOGLOBIN: 11.1 g/dL — AB (ref 13.0–17.7)
MCH: 30.2 pg (ref 26.6–33.0)
MCHC: 32.8 g/dL (ref 31.5–35.7)
MCV: 92 fL (ref 79–97)
PLATELETS: 153 10*3/uL (ref 150–379)
RBC: 3.68 x10E6/uL — ABNORMAL LOW (ref 4.14–5.80)
RDW: 16.2 % — ABNORMAL HIGH (ref 12.3–15.4)
WBC: 8.4 10*3/uL (ref 3.4–10.8)

## 2017-06-11 LAB — T4, FREE: FREE T4: 1.3 ng/dL (ref 0.82–1.77)

## 2017-06-11 LAB — TSH: TSH: 1.68 u[IU]/mL (ref 0.450–4.500)

## 2017-06-11 NOTE — Telephone Encounter (Signed)
Pt called upset stating that his grandson called "drug control" on him last night. He did not sleep but 10 minutes last night and he wants him medications put back like they were. He wanted to speak to Dr. Caryn Section, Now. Pt was very upset and I could barely understand him. He just kept saying he wants his pills back like they were.

## 2017-06-11 NOTE — Telephone Encounter (Signed)
Tried calling pt with results. No answer and no vm. Will try again later.  

## 2017-06-11 NOTE — Telephone Encounter (Signed)
-----   Message from Birdie Sons, MD sent at 06/11/2017  7:47 AM EDT ----- Labs are normal. Continue current medications. If he is not feeling better in a week or two then I would recommend referral to psychiatry.

## 2017-06-11 NOTE — Telephone Encounter (Signed)
Please advise 

## 2017-06-18 ENCOUNTER — Encounter: Payer: Self-pay | Admitting: Family Medicine

## 2017-07-05 DEATH — deceased
